# Patient Record
Sex: Female | Born: 1990
Health system: Southern US, Community
[De-identification: ages and names within clinical notes are randomized; demographics above are authoritative.]

## PROBLEM LIST (undated history)

## (undated) DIAGNOSIS — Z808 Family history of malignant neoplasm of other organs or systems: Secondary | ICD-10-CM

## (undated) DIAGNOSIS — G43909 Migraine, unspecified, not intractable, without status migrainosus: Secondary | ICD-10-CM

## (undated) DIAGNOSIS — Z8739 Personal history of other diseases of the musculoskeletal system and connective tissue: Secondary | ICD-10-CM

## (undated) DIAGNOSIS — M779 Enthesopathy, unspecified: Secondary | ICD-10-CM

## (undated) DIAGNOSIS — Z800 Family history of malignant neoplasm of digestive organs: Secondary | ICD-10-CM

## (undated) DIAGNOSIS — Z803 Family history of malignant neoplasm of breast: Secondary | ICD-10-CM

## (undated) DIAGNOSIS — Z8481 Family history of carrier of genetic disease: Secondary | ICD-10-CM

## (undated) DIAGNOSIS — N915 Oligomenorrhea, unspecified: Secondary | ICD-10-CM

## (undated) DIAGNOSIS — N63 Unspecified lump in unspecified breast: Secondary | ICD-10-CM

## (undated) DIAGNOSIS — S92902A Unspecified fracture of left foot, initial encounter for closed fracture: Secondary | ICD-10-CM

## (undated) DIAGNOSIS — Z1501 Genetic susceptibility to malignant neoplasm of breast: Secondary | ICD-10-CM

## (undated) DIAGNOSIS — F419 Anxiety disorder, unspecified: Secondary | ICD-10-CM

## (undated) HISTORY — DX: Genetic susceptibility to malignant neoplasm of breast: Z15.01

## (undated) HISTORY — DX: Enthesopathy, unspecified: M77.9

## (undated) HISTORY — DX: Genetic susceptibility to malignant neoplasm of ovary: Z15.01

## (undated) HISTORY — DX: Family history of malignant neoplasm of digestive organs: Z80.0

## (undated) HISTORY — DX: Anxiety disorder, unspecified: F41.9

## (undated) HISTORY — DX: Family history of malignant neoplasm of other organs or systems: Z80.8

## (undated) HISTORY — DX: Personal history of other diseases of the musculoskeletal system and connective tissue: Z87.39

## (undated) HISTORY — PX: OTHER SURGICAL HISTORY: SHX169

## (undated) HISTORY — DX: Family history of malignant neoplasm of breast: Z80.3

## (undated) HISTORY — DX: Oligomenorrhea, unspecified: N91.5

## (undated) HISTORY — DX: Migraine, unspecified, not intractable, without status migrainosus: G43.909

## (undated) HISTORY — PX: WISDOM TOOTH EXTRACTION: SHX21

## (undated) HISTORY — DX: Family history of carrier of genetic disease: Z84.81

## (undated) HISTORY — DX: Unspecified fracture of left foot, initial encounter for closed fracture: S92.902A

---
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
## 2012-08-17 HISTORY — PX: IRRIGATION AND DEBRIDEMENT SEBACEOUS CYST: SHX5255

## 2012-10-01 ENCOUNTER — Telehealth: Payer: Self-pay | Admitting: Nurse Practitioner

## 2012-10-01 NOTE — Telephone Encounter (Signed)
OK to refill Junel and make it without the iron or perfectly OK to skip that week.  I'm glad she is doing well on this. RF through to AEX.

## 2012-10-01 NOTE — Telephone Encounter (Signed)
LMTCB  aa 

## 2012-10-01 NOTE — Telephone Encounter (Signed)
Spoke with pt who is on a 3 month trial of Junel FE. Pt likes the med except for having some diarrhea 2 out of 3 months when taking the iron pills. Pt wondering if she can just take the pills without iron. Pt also will need a refill beyond this week as she is on her last pack.  Please advise.  aa

## 2012-10-01 NOTE — Telephone Encounter (Signed)
Junel FE/patient has questions for nurse/Buckner

## 2012-10-02 MED ORDER — NORETHINDRONE ACET-ETHINYL EST 1-20 MG-MCG PO TABS: 1.0000 | ORAL_TABLET | Freq: Every day | ORAL | Status: DC

## 2012-10-02 NOTE — Telephone Encounter (Signed)
Patient notified of new Rx sent to [facility] pharmacy without iron.

## 2013-04-17 ENCOUNTER — Other Ambulatory Visit: Payer: Self-pay | Admitting: Nurse Practitioner

## 2013-04-17 NOTE — Telephone Encounter (Signed)
Patient  Has annual schedule for 07/04/12 need a refill for medication

## 2013-07-04 ENCOUNTER — Ambulatory Visit (INDEPENDENT_AMBULATORY_CARE_PROVIDER_SITE_OTHER): Payer: 59 | Admitting: Nurse Practitioner

## 2013-07-04 ENCOUNTER — Encounter: Payer: Self-pay | Admitting: Nurse Practitioner

## 2013-07-04 VITALS — BP 130/88 | HR 80 | Ht 65.0 in | Wt 114.0 lb

## 2013-07-04 DIAGNOSIS — Z0000 Encounter for general adult medical examination without abnormal findings: Secondary | ICD-10-CM

## 2013-07-04 DIAGNOSIS — Z01419 Encounter for gynecological examination (general) (routine) without abnormal findings: Secondary | ICD-10-CM

## 2013-07-04 LAB — HEMOGLOBIN, FINGERSTICK: Hemoglobin, fingerstick: 14.4 g/dL (ref 12.0–16.0)

## 2013-07-04 MED ORDER — NORETHIN ACE-ETH ESTRAD-FE 1-20 MG-MCG PO TABS: 1.0000 | ORAL_TABLET | Freq: Every day | ORAL | Status: DC

## 2013-07-04 NOTE — Progress Notes (Signed)
Patient ID: Dawn CorporalLauren [name], female   DOB: 10/08/1990, 23 y.o.   MRN: [id number] 23 y.o. G0P0 Single Caucasian Fe here for annual exam.  Usually menses are 3-4 days and  much lighter and less headaches on OCP.  Never SA and not dating.  Continues to consider graduate degree in college for English major. She is currently working for her parents and seeking other employment.  Patient's last menstrual period was 06/08/2013.          Sexually active: no  The current method of family planning is abstinence.    Exercising: yes  Gym/ health club routine includes Jeet Kune Do, yoga, basic weight/core. Smoker:  no  Health Maintenance: Pap:  06/17/12, WNL TDaP: UTD Gardasil:  Declined Labs:  HB: 14.4    reports that she has never smoked. She has never used smokeless tobacco. She reports that she does not drink alcohol or use illicit drugs.  No past medical history on file.  Past Surgical History  Procedure Laterality Date  . Wisdom tooth extraction      Current Outpatient Prescriptions  Medication Sig Dispense Refill  . JUNEL FE 1/20 1-20 MG-MCG tablet Take 1 tablet by mouth daily.       No current facility-administered medications for this visit.    Family History  Problem Relation Age of Onset  . Hyperlipidemia Mother   . Hypertension Mother   . Breast cancer Maternal Grandmother 40  . Parkinson's disease Paternal Grandmother   . Colon cancer Paternal Grandfather     ROS:  Pertinent items are noted in HPI.  Otherwise, a comprehensive ROS was negative.  Exam:   BP 130/88  Pulse 80  Ht 5\' 5"  (1.651 m)  Wt 114 lb (51.71 kg)  BMI 18.97 kg/m2  LMP 06/08/2013 Height: 5\' 5"  (165.1 cm)  Ht Readings from Last 3 Encounters:  07/04/13 5\' 5"  (1.651 m)    General appearance: alert, cooperative and appears stated age Head: Normocephalic, without obvious abnormality, atraumatic Neck: no adenopathy, supple, symmetrical, trachea midline and thyroid normal to inspection and palpation Lungs:  clear to auscultation bilaterally Breasts: normal appearance, no masses or tenderness Heart: regular rate and rhythm Abdomen: soft, non-tender; no masses,  no organomegaly Extremities: extremities normal, atraumatic, no cyanosis or edema Skin: Skin color, texture, turgor normal. No rashes or lesions Lymph nodes: Cervical, supraclavicular, and axillary nodes normal. No abnormal inguinal nodes palpated Neurologic: Grossly normal   Pelvic: External genitalia:  no lesions              Urethra:  normal appearing urethra with no masses, tenderness or lesions              Bartholin's and Skene's: normal                 Vagina: normal appearing vagina with normal color and discharge, no lesions              Cervix: anteverted              Pap taken: no Bimanual Exam:  Uterus:  normal size, contour, position, consistency, mobility, non-tender              Adnexa: no mass, fullness, tenderness               Rectovaginal: Confirms               Anus:  normal sphincter tone, no lesions  A:  Well Woman with normal exam  History  of irregular menses and headaches - better on OCP  P:   Pap smear as per guidelines Not done  Refill on Loestrin Fe 1/20 for a year  Counseled on breast self exam, STD prevention, use and side effects of OCP's, adequate intake of calcium and vitamin D, diet and exercise return annually or prn  An After Visit Summary was printed and given to the patient.

## 2013-07-04 NOTE — Patient Instructions (Signed)
General topics  Next pap or exam is  due in 1 year Take a Women's multivitamin Take 1200 mg. of calcium daily - prefer dietary If any concerns in interim to call back  Breast Self-Awareness Practicing breast self-awareness may pick up problems early, prevent significant medical complications, and possibly save your life. By practicing breast self-awareness, you can become familiar with how your breasts look and feel and if your breasts are changing. This allows you to notice changes early. It can also offer you some reassurance that your breast health is good. One way to learn what is normal for your breasts and whether your breasts are changing is to do a breast self-exam. If you find a lump or something that was not present in the past, it is best to contact your caregiver right away. Other findings that should be evaluated by your caregiver include nipple discharge, especially if it is bloody; skin changes or reddening; areas where the skin seems to be pulled in (retracted); or new lumps and bumps. Breast pain is seldom associated with cancer (malignancy), but should also be evaluated by a caregiver. BREAST SELF-EXAM The best time to examine your breasts is 5 7 days after your menstrual period is over.  ExitCare Patient Information 2013 [employer].   Exercise to Stay Healthy Exercise helps you become and stay healthy. EXERCISE IDEAS AND TIPS Choose exercises that:  You enjoy.  Fit into your day. You do not need to exercise really hard to be healthy. You can do exercises at a slow or medium level and stay healthy. You can:  Stretch before and after working out.  Try yoga, Pilates, or tai chi.  Lift weights.  Walk fast, swim, jog, run, climb stairs, bicycle, dance, or rollerskate.  Take aerobic classes. Exercises that burn about 150 calories:  Running 1  miles in 15 minutes.  Playing volleyball for 45 to 60 minutes.  Washing and waxing a car for 45 to 60  minutes.  Playing touch football for 45 minutes.  Walking 1  miles in 35 minutes.  Pushing a stroller 1  miles in 30 minutes.  Playing basketball for 30 minutes.  Raking leaves for 30 minutes.  Bicycling 5 miles in 30 minutes.  Walking 2 miles in 30 minutes.  Dancing for 30 minutes.  Shoveling snow for 15 minutes.  Swimming laps for 20 minutes.  Walking up stairs for 15 minutes.  Bicycling 4 miles in 15 minutes.  Gardening for 30 to 45 minutes.  Jumping rope for 15 minutes.  Washing windows or floors for 45 to 60 minutes. Document Released: 07/08/2010 Document Revised: 08/28/2011 Document Reviewed: 07/08/2010 ExitCare Patient Information 2013 ExitCare[employer].   Other topics ( that may be useful information):    Sexually Transmitted Disease Sexually transmitted disease (STD) refers to any infection that is passed from person to person during sexual activity. This may happen by way of saliva, semen, blood, vaginal mucus, or urine. Common STDs include:  Gonorrhea.  Chlamydia.  Syphilis.  HIV/AIDS.  Genital herpes.  Hepatitis B and C.  Trichomonas.  Human papillomavirus (HPV).  Pubic lice. CAUSES  An STD may be spread by bacteria, virus, or parasite. A person can get an STD by:  Sexual intercourse with an infected person.  Sharing sex toys with an infected person.  Sharing needles with an infected person.  Having intimate contact with the genitals, mouth, or rectal areas of an infected person. SYMPTOMS  Some people may not have any symptoms, but   they can still pass the infection to others. Different STDs have different symptoms. Symptoms include:  Painful or bloody urination.  Pain in the pelvis, abdomen, vagina, anus, throat, or eyes.  Skin rash, itching, irritation, growths, or sores (lesions). These usually occur in the genital or anal area.  Abnormal vaginal discharge.  Penile discharge in men.  Soft, flesh-colored skin growths in the  genital or anal area.  Fever.  Pain or bleeding during sexual intercourse.  Swollen glands in the groin area.  Yellow skin and eyes (jaundice). This is seen with hepatitis. DIAGNOSIS  To make a diagnosis, your caregiver may:  Take a medical history.  Perform a physical exam.  Take a specimen (culture) to be examined.  Examine a sample of discharge under a microscope.  Perform blood test TREATMENT   Chlamydia, gonorrhea, trichomonas, and syphilis can be cured with antibiotic medicine.  Genital herpes, hepatitis, and HIV can be treated, but not cured, with prescribed medicines. The medicines will lessen the symptoms.  Genital warts from HPV can be treated with medicine or by freezing, burning (electrocautery), or surgery. Warts may come back.  HPV is a virus and cannot be cured with medicine or surgery.However, abnormal areas may be followed very closely by your caregiver and may be removed from the cervix, vagina, or vulva through office procedures or surgery. If your diagnosis is confirmed, your recent sexual partners need treatment. This is true even if they are symptom-free or have a negative culture or evaluation. They should not have sex until their caregiver says it is okay. HOME CARE INSTRUCTIONS  All sexual partners should be informed, tested, and treated for all STDs.  Take your antibiotics as directed. Finish them even if you start to feel better.  Only take over-the-counter or prescription medicines for pain, discomfort, or fever as directed by your caregiver.  Rest.  Eat a balanced diet and drink enough fluids to keep your urine clear or pale yellow.  Do not have sex until treatment is completed and you have followed up with your caregiver. STDs should be checked after treatment.  Keep all follow-up appointments, Pap tests, and blood tests as directed by your caregiver.  Only use latex condoms and water-soluble lubricants during sexual activity. Do not use  petroleum jelly or oils.  Avoid alcohol and illegal drugs.  Get vaccinated for HPV and hepatitis. If you have not received these vaccines in the past, talk to your caregiver about whether one or both might be right for you.  Avoid risky sex practices that can break the skin. The only way to avoid getting an STD is to avoid all sexual activity.Latex condoms and dental dams (for oral sex) will help lessen the risk of getting an STD, but will not completely eliminate the risk. SEEK MEDICAL CARE IF:   You have a fever.  You have any new or worsening symptoms. Document Released: 08/26/2002 Document Revised: 08/28/2011 Document Reviewed: 09/02/2010 [facility] Patient Information 2013 [employer].    Domestic Abuse You are being battered or abused if someone close to you hits, pushes, or physically hurts you in any way. You also are being abused if you are forced into activities. You are being sexually abused if you are forced to have sexual contact of any kind. You are being emotionally abused if you are made to feel worthless or if you are constantly threatened. It is important to remember that help is available. No one has the right to abuse you. PREVENTION OF FURTHER  ABUSE  Learn the warning signs of danger. This varies with situations but may include: the use of alcohol, threats, isolation from friends and family, or forced sexual contact. Leave if you feel that violence is going to occur.  If you are attacked or beaten, report it to the police so the abuse is documented. You do not have to press charges. The police can protect you while you or the attackers are leaving. Get the officer's name and badge number and a copy of the report.  Find someone you can trust and tell them what is happening to you: your caregiver, a nurse, clergy member, close friend or family member. Feeling ashamed is natural, but remember that you have done nothing wrong. No one deserves abuse. Document Released:  06/02/2000 Document Revised: 08/28/2011 Document Reviewed: 08/11/2010 ExitCare Patient Information 2013 ExitCare[employer] LLC.    How Much is Too Much Alcohol? Drinking too much alcohol can cause injury, accidents, and health problems. These types of problems can include:   Car crashes.  Falls.  Family fighting (domestic violence).  Drowning.  Fights.  Injuries.  Burns.  Damage to certain organs.  Having a baby with birth defects. ONE DRINK CAN BE TOO MUCH WHEN YOU ARE:  Working.  Pregnant or breastfeeding.  Taking medicines. Ask your doctor.  Driving or planning to drive. If you or someone you know has a drinking problem, get help from a doctor.  Document Released: 04/01/2009 Document Revised: 08/28/2011 Document Reviewed: 04/01/2009 ExitCare Patient Information 2013 ExitCare[employer].   Smoking Hazards Smoking cigarettes is extremely bad for your health. Tobacco smoke has over 200 known poisons in it. There are over 60 chemicals in tobacco smoke that cause cancer. Some of the chemicals found in cigarette smoke include:   Cyanide.  Benzene.  Formaldehyde.  Methanol (wood alcohol).  Acetylene (fuel used in welding torches).  Ammonia. Cigarette smoke also contains the poisonous gases nitrogen oxide and carbon monoxide.  Cigarette smokers have an increased risk of many serious medical problems and Smoking causes approximately:  90% of all lung cancer deaths in men.  80% of all lung cancer deaths in women.  90% of deaths from chronic obstructive lung disease. Compared with nonsmokers, smoking increases the risk of:  Coronary heart disease by 2 to 4 times.  Stroke by 2 to 4 times.  Men developing lung cancer by 23 times.  Women developing lung cancer by 13 times.  Dying from chronic obstructive lung diseases by 12 times.  . Smoking is the most preventable cause of death and disease in our society.  WHY IS SMOKING ADDICTIVE?  Nicotine is the chemical  agent in tobacco that is capable of causing addiction or dependence.  When you smoke and inhale, nicotine is absorbed rapidly into the bloodstream through your lungs. Nicotine absorbed through the lungs is capable of creating a powerful addiction. Both inhaled and non-inhaled nicotine may be addictive.  Addiction studies of cigarettes and spit tobacco show that addiction to nicotine occurs mainly during the teen years, when young people begin using tobacco products. WHAT ARE THE BENEFITS OF QUITTING?  There are many health benefits to quitting smoking.   Likelihood of developing cancer and heart disease decreases. Health improvements are seen almost immediately.  Blood pressure, pulse rate, and breathing patterns start returning to normal soon after quitting. QUITTING SMOKING   American Lung Association - 1-800-LUNGUSA  American Cancer Society - 1-800-ACS-2345 Document Released: 07/13/2004 Document Revised: 08/28/2011 Document Reviewed: 03/17/2009 ExitCare Patient Information 2013 ExitCare,   LLC.   Stress Management Stress is a state of physical or mental tension that often results from changes in your life or normal routine. Some common causes of stress are:  Death of a loved one.  Injuries or severe illnesses.  Getting fired or changing jobs.  Moving into a new home. Other causes may be:  Sexual problems.  Business or financial losses.  Taking on a large debt.  Regular conflict with someone at home or at work.  Constant tiredness from lack of sleep. It is not just bad things that are stressful. It may be stressful to:  Win the lottery.  Get married.  Buy a new car. The amount of stress that can be easily tolerated varies from person to person. Changes generally cause stress, regardless of the types of change. Too much stress can affect your health. It may lead to physical or emotional problems. Too little stress (boredom) may also become stressful. SUGGESTIONS TO  REDUCE STRESS:  Talk things over with your family and friends. It often is helpful to share your concerns and worries. If you feel your problem is serious, you may want to get help from a professional counselor.  Consider your problems one at a time instead of lumping them all together. Trying to take care of everything at once may seem impossible. List all the things you need to do and then start with the most important one. Set a goal to accomplish 2 or 3 things each day. If you expect to do too many in a single day you will naturally fail, causing you to feel even more stressed.  Do not use alcohol or drugs to relieve stress. Although you may feel better for a short time, they do not remove the problems that caused the stress. They can also be habit forming.  Exercise regularly - at least 3 times per week. Physical exercise can help to relieve that "uptight" feeling and will relax you.  The shortest distance between despair and hope is often a good night's sleep.  Go to bed and get up on time allowing yourself time for appointments without being rushed.  Take a short "time-out" period from any stressful situation that occurs during the day. Close your eyes and take some deep breaths. Starting with the muscles in your face, tense them, hold it for a few seconds, then relax. Repeat this with the muscles in your neck, shoulders, hand, stomach, back and legs.  Take good care of yourself. Eat a balanced diet and get plenty of rest.  Schedule time for having fun. Take a break from your daily routine to relax. HOME CARE INSTRUCTIONS   Call if you feel overwhelmed by your problems and feel you can no longer manage them on your own.  Return immediately if you feel like hurting yourself or someone else. Document Released: 11/29/2000 Document Revised: 08/28/2011 Document Reviewed: 07/22/2007 ExitCare Patient Information 2013 ExitCare, LLC.  

## 2013-07-07 NOTE — Progress Notes (Signed)
Encounter reviewed by Dr. Larraine Argo Silva.  

## 2014-06-22 ENCOUNTER — Ambulatory Visit: Payer: Self-pay | Admitting: Nurse Practitioner

## 2014-07-10 ENCOUNTER — Ambulatory Visit: Payer: 59 | Admitting: Nurse Practitioner

## 2014-07-13 ENCOUNTER — Other Ambulatory Visit: Payer: Self-pay | Admitting: *Deleted

## 2014-07-13 MED ORDER — NORETHIN ACE-ETH ESTRAD-FE 1-20 MG-MCG PO TABS: 1.0000 | ORAL_TABLET | Freq: Every day | ORAL | Status: DC

## 2014-07-13 NOTE — Telephone Encounter (Signed)
Incoming fax from [facility]  Medication refill request: Junel Fe 1/20 Last AEX:  07/04/13 Next AEX: 08/18/14 Last MMG (if hormonal medication request): None Refill authorized: 07/04/13 #3packs/3R. Today #3packs/0R?

## 2014-08-03 ENCOUNTER — Other Ambulatory Visit: Payer: Self-pay | Admitting: Nurse Practitioner

## 2014-08-03 ENCOUNTER — Ambulatory Visit: Payer: Self-pay | Admitting: Nurse Practitioner

## 2014-08-03 NOTE — Telephone Encounter (Signed)
Medication refill request: Junel 1/20 mcg Last AEX:  07/04/13 with Ms. Patty Next AEX: 10/13/14 with MS. Patty Last MMG (if hormonal medication request): N/A Refill authorized: #3 Packs/0 rfs, please advise.

## 2014-08-03 NOTE — Telephone Encounter (Signed)
Patient requesting refills on Junel FE. Pharmacy on file is correct. AEX 10/13/14.

## 2014-08-04 MED ORDER — NORETHIN ACE-ETH ESTRAD-FE 1-20 MG-MCG PO TABS: 1.0000 | ORAL_TABLET | Freq: Every day | ORAL | Status: DC

## 2014-08-04 NOTE — Telephone Encounter (Signed)
Called and s/w patient she is aware. 

## 2014-08-18 ENCOUNTER — Ambulatory Visit: Payer: Self-pay | Admitting: Nurse Practitioner

## 2014-08-20 ENCOUNTER — Encounter: Payer: Self-pay | Admitting: Nurse Practitioner

## 2014-08-20 ENCOUNTER — Ambulatory Visit (INDEPENDENT_AMBULATORY_CARE_PROVIDER_SITE_OTHER): Payer: BLUE CROSS/BLUE SHIELD | Admitting: Nurse Practitioner

## 2014-08-20 VITALS — BP 120/78 | HR 92 | Ht 64.75 in | Wt 124.6 lb

## 2014-08-20 DIAGNOSIS — R8290 Unspecified abnormal findings in urine: Secondary | ICD-10-CM

## 2014-08-20 DIAGNOSIS — Z0000 Encounter for general adult medical examination without abnormal findings: Secondary | ICD-10-CM | POA: Diagnosis not present

## 2014-08-20 DIAGNOSIS — Z01419 Encounter for gynecological examination (general) (routine) without abnormal findings: Secondary | ICD-10-CM | POA: Diagnosis not present

## 2014-08-20 LAB — TSH: TSH: 3.017 u[IU]/mL (ref 0.350–4.500)

## 2014-08-20 LAB — POCT URINALYSIS DIPSTICK
Urobilinogen, UA: NEGATIVE
pH, UA: 5

## 2014-08-20 LAB — COMPREHENSIVE METABOLIC PANEL
ALBUMIN: 5 g/dL (ref 3.5–5.2)
ALT: 16 U/L (ref 0–35)
AST: 22 U/L (ref 0–37)
Alkaline Phosphatase: 33 U/L — ABNORMAL LOW (ref 39–117)
BUN: 7 mg/dL (ref 6–23)
CALCIUM: 9.8 mg/dL (ref 8.4–10.5)
CHLORIDE: 101 meq/L (ref 96–112)
CO2: 24 meq/L (ref 19–32)
Creat: 0.64 mg/dL (ref 0.50–1.10)
Glucose, Bld: 92 mg/dL (ref 70–99)
Potassium: 3.4 mEq/L — ABNORMAL LOW (ref 3.5–5.3)
Sodium: 139 mEq/L (ref 135–145)
TOTAL PROTEIN: 8.2 g/dL (ref 6.0–8.3)
Total Bilirubin: 0.7 mg/dL (ref 0.2–1.2)

## 2014-08-20 LAB — LIPID PANEL
Cholesterol: 207 mg/dL — ABNORMAL HIGH (ref 0–200)
HDL: 75 mg/dL (ref >=46)
LDL Cholesterol: 117 mg/dL — ABNORMAL HIGH (ref 0–99)
TRIGLYCERIDES: 73 mg/dL (ref <150)
Total CHOL/HDL Ratio: 2.8 Ratio
VLDL: 15 mg/dL (ref 0–40)

## 2014-08-20 LAB — HEMOGLOBIN, FINGERSTICK: Hemoglobin, fingerstick: 15 g/dL (ref 12.0–16.0)

## 2014-08-20 MED ORDER — JUNEL FE 1/20 1-20 MG-MCG PO TABS: 1.0000 | ORAL_TABLET | Freq: Every day | ORAL | Status: DC

## 2014-08-20 NOTE — Patient Instructions (Signed)
General topics  Next pap or exam is  due in 1 year Take a Women's multivitamin Take 1200 mg. of calcium daily - prefer dietary If any concerns in interim to call back  Breast Self-Awareness Practicing breast self-awareness may pick up problems early, prevent significant medical complications, and possibly save your life. By practicing breast self-awareness, you can become familiar with how your breasts look and feel and if your breasts are changing. This allows you to notice changes early. It can also offer you some reassurance that your breast health is good. One way to learn what is normal for your breasts and whether your breasts are changing is to do a breast self-exam. If you find a lump or something that was not present in the past, it is best to contact your caregiver right away. Other findings that should be evaluated by your caregiver include nipple discharge, especially if it is bloody; skin changes or reddening; areas where the skin seems to be pulled in (retracted); or new lumps and bumps. Breast pain is seldom associated with cancer (malignancy), but should also be evaluated by a caregiver. BREAST SELF-EXAM The best time to examine your breasts is 5 7 days after your menstrual period is over.  ExitCare Patient Information 2013 [employer].   Exercise to Stay Healthy Exercise helps you become and stay healthy. EXERCISE IDEAS AND TIPS Choose exercises that:  You enjoy.  Fit into your day. You do not need to exercise really hard to be healthy. You can do exercises at a slow or medium level and stay healthy. You can:  Stretch before and after working out.  Try yoga, Pilates, or tai chi.  Lift weights.  Walk fast, swim, jog, run, climb stairs, bicycle, dance, or rollerskate.  Take aerobic classes. Exercises that burn about 150 calories:  Running 1  miles in 15 minutes.  Playing volleyball for 45 to 60 minutes.  Washing and waxing a car for 45 to 60  minutes.  Playing touch football for 45 minutes.  Walking 1  miles in 35 minutes.  Pushing a stroller 1  miles in 30 minutes.  Playing basketball for 30 minutes.  Raking leaves for 30 minutes.  Bicycling 5 miles in 30 minutes.  Walking 2 miles in 30 minutes.  Dancing for 30 minutes.  Shoveling snow for 15 minutes.  Swimming laps for 20 minutes.  Walking up stairs for 15 minutes.  Bicycling 4 miles in 15 minutes.  Gardening for 30 to 45 minutes.  Jumping rope for 15 minutes.  Washing windows or floors for 45 to 60 minutes. Document Released: 07/08/2010 Document Revised: 08/28/2011 Document Reviewed: 07/08/2010 ExitCare Patient Information 2013 ExitCare[employer].   Other topics ( that may be useful information):    Sexually Transmitted Disease Sexually transmitted disease (STD) refers to any infection that is passed from person to person during sexual activity. This may happen by way of saliva, semen, blood, vaginal mucus, or urine. Common STDs include:  Gonorrhea.  Chlamydia.  Syphilis.  HIV/AIDS.  Genital herpes.  Hepatitis B and C.  Trichomonas.  Human papillomavirus (HPV).  Pubic lice. CAUSES  An STD may be spread by bacteria, virus, or parasite. A person can get an STD by:  Sexual intercourse with an infected person.  Sharing sex toys with an infected person.  Sharing needles with an infected person.  Having intimate contact with the genitals, mouth, or rectal areas of an infected person. SYMPTOMS  Some people may not have any symptoms, but   they can still pass the infection to others. Different STDs have different symptoms. Symptoms include:  Painful or bloody urination.  Pain in the pelvis, abdomen, vagina, anus, throat, or eyes.  Skin rash, itching, irritation, growths, or sores (lesions). These usually occur in the genital or anal area.  Abnormal vaginal discharge.  Penile discharge in men.  Soft, flesh-colored skin growths in the  genital or anal area.  Fever.  Pain or bleeding during sexual intercourse.  Swollen glands in the groin area.  Yellow skin and eyes (jaundice). This is seen with hepatitis. DIAGNOSIS  To make a diagnosis, your caregiver may:  Take a medical history.  Perform a physical exam.  Take a specimen (culture) to be examined.  Examine a sample of discharge under a microscope.  Perform blood test TREATMENT   Chlamydia, gonorrhea, trichomonas, and syphilis can be cured with antibiotic medicine.  Genital herpes, hepatitis, and HIV can be treated, but not cured, with prescribed medicines. The medicines will lessen the symptoms.  Genital warts from HPV can be treated with medicine or by freezing, burning (electrocautery), or surgery. Warts may come back.  HPV is a virus and cannot be cured with medicine or surgery.However, abnormal areas may be followed very closely by your caregiver and may be removed from the cervix, vagina, or vulva through office procedures or surgery. If your diagnosis is confirmed, your recent sexual partners need treatment. This is true even if they are symptom-free or have a negative culture or evaluation. They should not have sex until their caregiver says it is okay. HOME CARE INSTRUCTIONS  All sexual partners should be informed, tested, and treated for all STDs.  Take your antibiotics as directed. Finish them even if you start to feel better.  Only take over-the-counter or prescription medicines for pain, discomfort, or fever as directed by your caregiver.  Rest.  Eat a balanced diet and drink enough fluids to keep your urine clear or pale yellow.  Do not have sex until treatment is completed and you have followed up with your caregiver. STDs should be checked after treatment.  Keep all follow-up appointments, Pap tests, and blood tests as directed by your caregiver.  Only use latex condoms and water-soluble lubricants during sexual activity. Do not use  petroleum jelly or oils.  Avoid alcohol and illegal drugs.  Get vaccinated for HPV and hepatitis. If you have not received these vaccines in the past, talk to your caregiver about whether one or both might be right for you.  Avoid risky sex practices that can break the skin. The only way to avoid getting an STD is to avoid all sexual activity.Latex condoms and dental dams (for oral sex) will help lessen the risk of getting an STD, but will not completely eliminate the risk. SEEK MEDICAL CARE IF:   You have a fever.  You have any new or worsening symptoms. Document Released: 08/26/2002 Document Revised: 08/28/2011 Document Reviewed: 09/02/2010 [facility] Patient Information 2013 [employer].    Domestic Abuse You are being battered or abused if someone close to you hits, pushes, or physically hurts you in any way. You also are being abused if you are forced into activities. You are being sexually abused if you are forced to have sexual contact of any kind. You are being emotionally abused if you are made to feel worthless or if you are constantly threatened. It is important to remember that help is available. No one has the right to abuse you. PREVENTION OF FURTHER  ABUSE  Learn the warning signs of danger. This varies with situations but may include: the use of alcohol, threats, isolation from friends and family, or forced sexual contact. Leave if you feel that violence is going to occur.  If you are attacked or beaten, report it to the police so the abuse is documented. You do not have to press charges. The police can protect you while you or the attackers are leaving. Get the officer's name and badge number and a copy of the report.  Find someone you can trust and tell them what is happening to you: your caregiver, a nurse, clergy member, close friend or family member. Feeling ashamed is natural, but remember that you have done nothing wrong. No one deserves abuse. Document Released:  06/02/2000 Document Revised: 08/28/2011 Document Reviewed: 08/11/2010 ExitCare Patient Information 2013 ExitCare[employer] LLC.    How Much is Too Much Alcohol? Drinking too much alcohol can cause injury, accidents, and health problems. These types of problems can include:   Car crashes.  Falls.  Family fighting (domestic violence).  Drowning.  Fights.  Injuries.  Burns.  Damage to certain organs.  Having a baby with birth defects. ONE DRINK CAN BE TOO MUCH WHEN YOU ARE:  Working.  Pregnant or breastfeeding.  Taking medicines. Ask your doctor.  Driving or planning to drive. If you or someone you know has a drinking problem, get help from a doctor.  Document Released: 04/01/2009 Document Revised: 08/28/2011 Document Reviewed: 04/01/2009 ExitCare Patient Information 2013 ExitCare[employer].   Smoking Hazards Smoking cigarettes is extremely bad for your health. Tobacco smoke has over 200 known poisons in it. There are over 60 chemicals in tobacco smoke that cause cancer. Some of the chemicals found in cigarette smoke include:   Cyanide.  Benzene.  Formaldehyde.  Methanol (wood alcohol).  Acetylene (fuel used in welding torches).  Ammonia. Cigarette smoke also contains the poisonous gases nitrogen oxide and carbon monoxide.  Cigarette smokers have an increased risk of many serious medical problems and Smoking causes approximately:  90% of all lung cancer deaths in men.  80% of all lung cancer deaths in women.  90% of deaths from chronic obstructive lung disease. Compared with nonsmokers, smoking increases the risk of:  Coronary heart disease by 2 to 4 times.  Stroke by 2 to 4 times.  Men developing lung cancer by 23 times.  Women developing lung cancer by 13 times.  Dying from chronic obstructive lung diseases by 12 times.  . Smoking is the most preventable cause of death and disease in our society.  WHY IS SMOKING ADDICTIVE?  Nicotine is the chemical  agent in tobacco that is capable of causing addiction or dependence.  When you smoke and inhale, nicotine is absorbed rapidly into the bloodstream through your lungs. Nicotine absorbed through the lungs is capable of creating a powerful addiction. Both inhaled and non-inhaled nicotine may be addictive.  Addiction studies of cigarettes and spit tobacco show that addiction to nicotine occurs mainly during the teen years, when young people begin using tobacco products. WHAT ARE THE BENEFITS OF QUITTING?  There are many health benefits to quitting smoking.   Likelihood of developing cancer and heart disease decreases. Health improvements are seen almost immediately.  Blood pressure, pulse rate, and breathing patterns start returning to normal soon after quitting. QUITTING SMOKING   American Lung Association - 1-800-LUNGUSA  American Cancer Society - 1-800-ACS-2345 Document Released: 07/13/2004 Document Revised: 08/28/2011 Document Reviewed: 03/17/2009 ExitCare Patient Information 2013 ExitCare,   LLC.   Stress Management Stress is a state of physical or mental tension that often results from changes in your life or normal routine. Some common causes of stress are:  Death of a loved one.  Injuries or severe illnesses.  Getting fired or changing jobs.  Moving into a new home. Other causes may be:  Sexual problems.  Business or financial losses.  Taking on a large debt.  Regular conflict with someone at home or at work.  Constant tiredness from lack of sleep. It is not just bad things that are stressful. It may be stressful to:  Win the lottery.  Get married.  Buy a new car. The amount of stress that can be easily tolerated varies from person to person. Changes generally cause stress, regardless of the types of change. Too much stress can affect your health. It may lead to physical or emotional problems. Too little stress (boredom) may also become stressful. SUGGESTIONS TO  REDUCE STRESS:  Talk things over with your family and friends. It often is helpful to share your concerns and worries. If you feel your problem is serious, you may want to get help from a professional counselor.  Consider your problems one at a time instead of lumping them all together. Trying to take care of everything at once may seem impossible. List all the things you need to do and then start with the most important one. Set a goal to accomplish 2 or 3 things each day. If you expect to do too many in a single day you will naturally fail, causing you to feel even more stressed.  Do not use alcohol or drugs to relieve stress. Although you may feel better for a short time, they do not remove the problems that caused the stress. They can also be habit forming.  Exercise regularly - at least 3 times per week. Physical exercise can help to relieve that "uptight" feeling and will relax you.  The shortest distance between despair and hope is often a good night's sleep.  Go to bed and get up on time allowing yourself time for appointments without being rushed.  Take a short "time-out" period from any stressful situation that occurs during the day. Close your eyes and take some deep breaths. Starting with the muscles in your face, tense them, hold it for a few seconds, then relax. Repeat this with the muscles in your neck, shoulders, hand, stomach, back and legs.  Take good care of yourself. Eat a balanced diet and get plenty of rest.  Schedule time for having fun. Take a break from your daily routine to relax. HOME CARE INSTRUCTIONS   Call if you feel overwhelmed by your problems and feel you can no longer manage them on your own.  Return immediately if you feel like hurting yourself or someone else. Document Released: 11/29/2000 Document Revised: 08/28/2011 Document Reviewed: 07/22/2007 ExitCare Patient Information 2013 ExitCare, LLC.  

## 2014-08-20 NOTE — Progress Notes (Signed)
24 y.o. G0 Single  Caucasian Fe here for annual exam.  Still on OCP and doing well.  Menses at 2-3 days and light.  Still some HA's but not related to menses and not worse this past year. Just applied to [employer] for [employer].  Not dating or SA ever.  FMH: of high cholesterol.  She is having some anxiety issues and would like to speak to a counselor.  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  Patient's last menstrual period was 08/06/2014.          Sexually active: No.  The current method of family planning is oral OCP Exercising: Yes.    walking  couples x's/wk Smoker:  No   Health Maintenance: Pap:  06/17/12 Negative  TDaP:  2008 Labs: Hgb: 15.0 ; Urine: Trace Protein, WBC'S +, Ketones Small   reports that she has never smoked. She has never used smokeless tobacco. She reports that she drinks about 4.2 oz of alcohol per week. She reports that she does not use illicit drugs.  Past Medical History  Diagnosis Date  . Fracture of left foot 2007    treated with brace  . Oligomenorrhea     better on OCP  . Migraine     better with OCP    Past Surgical History  Procedure Laterality Date  . Wisdom tooth extraction    . Irrigation and debridement sebaceous cyst  08/2012    right shoulder area    Current Outpatient Prescriptions  Medication Sig Dispense Refill  . JUNEL FE 1/20 1-20 MG-MCG tablet Take 1 tablet by mouth daily. 3 Package 3   No current facility-administered medications for this visit.    Family History  Problem Relation Age of Onset  . Hyperlipidemia Mother   . Hypertension Mother   . Breast cancer Maternal Grandmother 40  . Parkinson's disease Paternal Grandmother   . Colon cancer Paternal Grandfather     ROS:  Pertinent items are noted in HPI.  Otherwise, a comprehensive ROS was negative.  Exam:   BP 120/78 mmHg  Pulse 92  Ht 5' 4.75" (1.645 m)  Wt 124 lb 9.6 oz (56.518 kg)  BMI 20.89 kg/m2  LMP 08/06/2014 Height: 5' 4.75" (164.5 cm) Ht  Readings from Last 3 Encounters:  08/20/14 5' 4.75" (1.645 m)  07/04/13  (1.651 m)    General appearance: alert, cooperative and appears stated age Head: Normocephalic, without obvious abnormality, atraumatic Neck: no adenopathy, supple, symmetrical, trachea midline and thyroid normal to inspection and palpation Lungs: clear to auscultation bilaterally Breasts: normal appearance, no masses or tenderness Heart: regular rate and rhythm Abdomen: soft, non-tender; no masses,  no organomegaly Extremities: extremities normal, atraumatic, no cyanosis or edema Skin: Skin color, texture, turgor normal. No rashes or lesions Lymph nodes: Cervical, supraclavicular, and axillary nodes normal. No abnormal inguinal nodes palpated Neurologic: Grossly normal   Pelvic: External genitalia:  no lesions              Urethra:  normal appearing urethra with no masses, tenderness or lesions              Bartholin's and Skene's: normal                 Vagina: very small introitus - normal appearing vagina with normal color and discharge, no lesions              Cervix: anteverted  Pap taken: No. Bimanual Exam:  Uterus:  normal size, contour, position, consistency, mobility, non-tender              Adnexa: no mass, fullness, tenderness               Rectovaginal: not done               Anus:  Not examined  Chaperone present: No  A:  Well Woman with normal exam  History of irregular menses and headaches - better on OCP  Never SA  History of anxiety  FMH: high cholesterol  P:   Reviewed health and wellness pertinent to exam  Pap smear not taken today  Refill on Junel Fe 1/20 for a year  Name and card given for Limited BrandsJulie Whitt   Counseled on breast self exam, use and side effects of OCP's, adequate intake of calcium and vitamin D, diet and exercise return annually or prn  An After Visit Summary was printed and given to the patient.

## 2014-08-21 LAB — VITAMIN D 25 HYDROXY (VIT D DEFICIENCY, FRACTURES): VIT D 25 HYDROXY: 18 ng/mL — AB (ref 30–100)

## 2014-08-22 LAB — URINE CULTURE
Colony Count: NO GROWTH
Organism ID, Bacteria: NO GROWTH

## 2014-08-23 ENCOUNTER — Other Ambulatory Visit: Payer: Self-pay | Admitting: Nurse Practitioner

## 2014-08-23 MED ORDER — VITAMIN D (ERGOCALCIFEROL) 1.25 MG (50000 UNIT) PO CAPS: 50000.0000 [IU] | ORAL_CAPSULE | ORAL | Status: DC

## 2014-08-24 ENCOUNTER — Encounter: Payer: Self-pay | Admitting: Nurse Practitioner

## 2014-08-24 NOTE — Progress Notes (Signed)
Encounter reviewed by Dr. Brook Silva.  

## 2014-08-31 ENCOUNTER — Encounter: Payer: Self-pay | Admitting: Nurse Practitioner

## 2014-10-13 ENCOUNTER — Ambulatory Visit: Payer: Self-pay | Admitting: Nurse Practitioner

## 2015-08-23 ENCOUNTER — Ambulatory Visit: Payer: BLUE CROSS/BLUE SHIELD | Admitting: Nurse Practitioner

## 2015-09-03 ENCOUNTER — Ambulatory Visit (INDEPENDENT_AMBULATORY_CARE_PROVIDER_SITE_OTHER): Payer: BLUE CROSS/BLUE SHIELD | Admitting: Nurse Practitioner

## 2015-09-03 ENCOUNTER — Encounter: Payer: Self-pay | Admitting: Nurse Practitioner

## 2015-09-03 VITALS — BP 118/82 | HR 74 | Resp 16 | Ht 65.25 in | Wt 127.0 lb

## 2015-09-03 DIAGNOSIS — Z0000 Encounter for general adult medical examination without abnormal findings: Secondary | ICD-10-CM | POA: Diagnosis not present

## 2015-09-03 DIAGNOSIS — Z01419 Encounter for gynecological examination (general) (routine) without abnormal findings: Secondary | ICD-10-CM

## 2015-09-03 LAB — POCT URINALYSIS DIPSTICK
Bilirubin, UA: NEGATIVE
Glucose, UA: NEGATIVE
Ketones, UA: NEGATIVE
Leukocytes, UA: NEGATIVE
Nitrite, UA: NEGATIVE
PROTEIN UA: NEGATIVE
RBC UA: NEGATIVE
UROBILINOGEN UA: NEGATIVE
pH, UA: 5

## 2015-09-03 LAB — LIPID PANEL
CHOL/HDL RATIO: 2.9 ratio (ref <=5.0)
CHOLESTEROL: 220 mg/dL — AB (ref 125–200)
HDL: 75 mg/dL (ref >=46)
LDL Cholesterol: 122 mg/dL (ref <130)
Triglycerides: 113 mg/dL (ref <150)
VLDL: 23 mg/dL (ref <30)

## 2015-09-03 LAB — TSH: TSH: 2.76 m[IU]/L

## 2015-09-03 MED ORDER — JUNEL FE 1/20 1-20 MG-MCG PO TABS: 1.0000 | ORAL_TABLET | Freq: Every day | ORAL | Status: DC

## 2015-09-03 NOTE — Progress Notes (Signed)
25 y.o. G0P0 Single  Caucasian Fe here for annual exam.  Menses 2-4 days. Mild cramps, slight PMS.  Not dating or SA ever.    Patient's last menstrual period was 08/31/2015.          Sexually active: No.  The current method of family planning is OCP (estrogen/progesterone).    Exercising: Yes.    walking Smoker:  no  Health Maintenance: Pap:  06-17-12 neg TDaP:  2008 HIV:  Done today Labs: poct urine-neg, Hgb-14.3 Self breast exam: not done   reports that she has never smoked. She has never used smokeless tobacco. She reports that she drinks about 0.6 oz of alcohol per week. She reports that she does not use illicit drugs.  Past Medical History  Diagnosis Date  . Fracture of left foot 2007    treated with brace  . Oligomenorrhea     better on OCP  . Migraine     better with OCP    Past Surgical History  Procedure Laterality Date  . Wisdom tooth extraction    . Irrigation and debridement sebaceous cyst  08/2012    right shoulder area  . Fishbone removal      pt swallowed fishbone & had to be surgically removed    Current Outpatient Prescriptions  Medication Sig Dispense Refill  . JUNEL FE 1/20 1-20 MG-MCG tablet Take 1 tablet by mouth daily. 3 Package 4  . L-THEANINE PO Take by mouth as needed.    . Loratadine 10 MG CAPS Take by mouth.    Marland Kitchen UNABLE TO FIND Ranitidine prn     No current facility-administered medications for this visit.    Family History  Problem Relation Age of Onset  . Hyperlipidemia Mother   . Hypertension Mother   . Breast cancer Maternal Grandmother 40  . Parkinson's disease Paternal Grandmother   . Colon cancer Paternal Grandfather     ROS:  Pertinent items are noted in HPI.  Otherwise, a comprehensive ROS was negative.  Exam:   BP 118/82 mmHg  Pulse 74  Resp 16  Ht 5' 5.25" (1.657 m)  Wt 127 lb (57.607 kg)  BMI 20.98 kg/m2  LMP 08/31/2015 Height: 5' 5.25" (165.7 cm) Ht Readings from Last 3 Encounters:  09/03/15 5' 5.25" (1.657 m)   08/20/14 5' 4.75" (1.645 m)  07/04/13  (1.651 m)    General appearance: alert, cooperative and appears stated age Head: Normocephalic, without obvious abnormality, atraumatic Neck: no adenopathy, supple, symmetrical, trachea midline and thyroid normal to inspection and palpation Lungs: clear to auscultation bilaterally Breasts: normal appearance, no masses or tenderness Heart: regular rate and rhythm Abdomen: soft, non-tender; no masses,  no organomegaly Extremities: extremities normal, atraumatic, no cyanosis or edema Skin: Skin color, texture, turgor normal. No rashes or lesions Lymph nodes: Cervical, supraclavicular, and axillary nodes normal. No abnormal inguinal nodes palpated Neurologic: Grossly normal   Pelvic: External genitalia:  no lesions              Urethra:  normal appearing urethra with no masses, tenderness or lesions              Bartholin's and Skene's: normal                 Vagina: normal appearing vagina with normal color and discharge, no lesions              Cervix: anteverted  Pap taken: Yes.   Bimanual Exam:  Uterus:  normal size, contour, position, consistency, mobility, non-tender              Adnexa: no mass, fullness, tenderness               Rectovaginal: Confirms               Anus:  normal sphincter tone, no lesions  Chaperone present: yes  A:  Well Woman with normal exam  History of irregular menses and headaches - better on OCP Never SA History of anxiety FMH: high cholesterol   P:   Reviewed health and wellness pertinent to exam  Pap smear as above  Refill on OCP Junel Fe 1/20 Brand only for a year  Follow with labs  Counseled on breast self exam, adequate intake of calcium and vitamin D, diet and exercise return annually or prn  An After Visit Summary was printed and given to the patient.

## 2015-09-03 NOTE — Patient Instructions (Signed)
General topics  Next pap or exam is  due in 1 year Take a Women's multivitamin Take 1200 mg. of calcium daily - prefer dietary If any concerns in interim to call back  Breast Self-Awareness Practicing breast self-awareness may pick up problems early, prevent significant medical complications, and possibly save your life. By practicing breast self-awareness, you can become familiar with how your breasts look and feel and if your breasts are changing. This allows you to notice changes early. It can also offer you some reassurance that your breast health is good. One way to learn what is normal for your breasts and whether your breasts are changing is to do a breast self-exam. If you find a lump or something that was not present in the past, it is best to contact your caregiver right away. Other findings that should be evaluated by your caregiver include nipple discharge, especially if it is bloody; skin changes or reddening; areas where the skin seems to be pulled in (retracted); or new lumps and bumps. Breast pain is seldom associated with cancer (malignancy), but should also be evaluated by a caregiver. BREAST SELF-EXAM The best time to examine your breasts is 5 7 days after your menstrual period is over.  ExitCare Patient Information 2013 [employer].   Exercise to Stay Healthy Exercise helps you become and stay healthy. EXERCISE IDEAS AND TIPS Choose exercises that:  You enjoy.  Fit into your day. You do not need to exercise really hard to be healthy. You can do exercises at a slow or medium level and stay healthy. You can:  Stretch before and after working out.  Try yoga, Pilates, or tai chi.  Lift weights.  Walk fast, swim, jog, run, climb stairs, bicycle, dance, or rollerskate.  Take aerobic classes. Exercises that burn about 150 calories:  Running 1  miles in 15 minutes.  Playing volleyball for 45 to 60 minutes.  Washing and waxing a car for 45 to 60  minutes.  Playing touch football for 45 minutes.  Walking 1  miles in 35 minutes.  Pushing a stroller 1  miles in 30 minutes.  Playing basketball for 30 minutes.  Raking leaves for 30 minutes.  Bicycling 5 miles in 30 minutes.  Walking 2 miles in 30 minutes.  Dancing for 30 minutes.  Shoveling snow for 15 minutes.  Swimming laps for 20 minutes.  Walking up stairs for 15 minutes.  Bicycling 4 miles in 15 minutes.  Gardening for 30 to 45 minutes.  Jumping rope for 15 minutes.  Washing windows or floors for 45 to 60 minutes. Document Released: 07/08/2010 Document Revised: 08/28/2011 Document Reviewed: 07/08/2010 ExitCare Patient Information 2013 ExitCare[employer].   Other topics ( that may be useful information):    Sexually Transmitted Disease Sexually transmitted disease (STD) refers to any infection that is passed from person to person during sexual activity. This may happen by way of saliva, semen, blood, vaginal mucus, or urine. Common STDs include:  Gonorrhea.  Chlamydia.  Syphilis.  HIV/AIDS.  Genital herpes.  Hepatitis B and C.  Trichomonas.  Human papillomavirus (HPV).  Pubic lice. CAUSES  An STD may be spread by bacteria, virus, or parasite. A person can get an STD by:  Sexual intercourse with an infected person.  Sharing sex toys with an infected person.  Sharing needles with an infected person.  Having intimate contact with the genitals, mouth, or rectal areas of an infected person. SYMPTOMS  Some people may not have any symptoms, but   they can still pass the infection to others. Different STDs have different symptoms. Symptoms include:  Painful or bloody urination.  Pain in the pelvis, abdomen, vagina, anus, throat, or eyes.  Skin rash, itching, irritation, growths, or sores (lesions). These usually occur in the genital or anal area.  Abnormal vaginal discharge.  Penile discharge in men.  Soft, flesh-colored skin growths in the  genital or anal area.  Fever.  Pain or bleeding during sexual intercourse.  Swollen glands in the groin area.  Yellow skin and eyes (jaundice). This is seen with hepatitis. DIAGNOSIS  To make a diagnosis, your caregiver may:  Take a medical history.  Perform a physical exam.  Take a specimen (culture) to be examined.  Examine a sample of discharge under a microscope.  Perform blood test TREATMENT   Chlamydia, gonorrhea, trichomonas, and syphilis can be cured with antibiotic medicine.  Genital herpes, hepatitis, and HIV can be treated, but not cured, with prescribed medicines. The medicines will lessen the symptoms.  Genital warts from HPV can be treated with medicine or by freezing, burning (electrocautery), or surgery. Warts may come back.  HPV is a virus and cannot be cured with medicine or surgery.However, abnormal areas may be followed very closely by your caregiver and may be removed from the cervix, vagina, or vulva through office procedures or surgery. If your diagnosis is confirmed, your recent sexual partners need treatment. This is true even if they are symptom-free or have a negative culture or evaluation. They should not have sex until their caregiver says it is okay. HOME CARE INSTRUCTIONS  All sexual partners should be informed, tested, and treated for all STDs.  Take your antibiotics as directed. Finish them even if you start to feel better.  Only take over-the-counter or prescription medicines for pain, discomfort, or fever as directed by your caregiver.  Rest.  Eat a balanced diet and drink enough fluids to keep your urine clear or pale yellow.  Do not have sex until treatment is completed and you have followed up with your caregiver. STDs should be checked after treatment.  Keep all follow-up appointments, Pap tests, and blood tests as directed by your caregiver.  Only use latex condoms and water-soluble lubricants during sexual activity. Do not use  petroleum jelly or oils.  Avoid alcohol and illegal drugs.  Get vaccinated for HPV and hepatitis. If you have not received these vaccines in the past, talk to your caregiver about whether one or both might be right for you.  Avoid risky sex practices that can break the skin. The only way to avoid getting an STD is to avoid all sexual activity.Latex condoms and dental dams (for oral sex) will help lessen the risk of getting an STD, but will not completely eliminate the risk. SEEK MEDICAL CARE IF:   You have a fever.  You have any new or worsening symptoms. Document Released: 08/26/2002 Document Revised: 08/28/2011 Document Reviewed: 09/02/2010 [facility] Patient Information 2013 [employer].    Domestic Abuse You are being battered or abused if someone close to you hits, pushes, or physically hurts you in any way. You also are being abused if you are forced into activities. You are being sexually abused if you are forced to have sexual contact of any kind. You are being emotionally abused if you are made to feel worthless or if you are constantly threatened. It is important to remember that help is available. No one has the right to abuse you. PREVENTION OF FURTHER  ABUSE  Learn the warning signs of danger. This varies with situations but may include: the use of alcohol, threats, isolation from friends and family, or forced sexual contact. Leave if you feel that violence is going to occur.  If you are attacked or beaten, report it to the police so the abuse is documented. You do not have to press charges. The police can protect you while you or the attackers are leaving. Get the officer's name and badge number and a copy of the report.  Find someone you can trust and tell them what is happening to you: your caregiver, a nurse, clergy member, close friend or family member. Feeling ashamed is natural, but remember that you have done nothing wrong. No one deserves abuse. Document Released:  06/02/2000 Document Revised: 08/28/2011 Document Reviewed: 08/11/2010 ExitCare Patient Information 2013 ExitCare[employer] LLC.    How Much is Too Much Alcohol? Drinking too much alcohol can cause injury, accidents, and health problems. These types of problems can include:   Car crashes.  Falls.  Family fighting (domestic violence).  Drowning.  Fights.  Injuries.  Burns.  Damage to certain organs.  Having a baby with birth defects. ONE DRINK CAN BE TOO MUCH WHEN YOU ARE:  Working.  Pregnant or breastfeeding.  Taking medicines. Ask your doctor.  Driving or planning to drive. If you or someone you know has a drinking problem, get help from a doctor.  Document Released: 04/01/2009 Document Revised: 08/28/2011 Document Reviewed: 04/01/2009 ExitCare Patient Information 2013 ExitCare[employer].   Smoking Hazards Smoking cigarettes is extremely bad for your health. Tobacco smoke has over 200 known poisons in it. There are over 60 chemicals in tobacco smoke that cause cancer. Some of the chemicals found in cigarette smoke include:   Cyanide.  Benzene.  Formaldehyde.  Methanol (wood alcohol).  Acetylene (fuel used in welding torches).  Ammonia. Cigarette smoke also contains the poisonous gases nitrogen oxide and carbon monoxide.  Cigarette smokers have an increased risk of many serious medical problems and Smoking causes approximately:  90% of all lung cancer deaths in men.  80% of all lung cancer deaths in women.  90% of deaths from chronic obstructive lung disease. Compared with nonsmokers, smoking increases the risk of:  Coronary heart disease by 2 to 4 times.  Stroke by 2 to 4 times.  Men developing lung cancer by 23 times.  Women developing lung cancer by 13 times.  Dying from chronic obstructive lung diseases by 12 times.  . Smoking is the most preventable cause of death and disease in our society.  WHY IS SMOKING ADDICTIVE?  Nicotine is the chemical  agent in tobacco that is capable of causing addiction or dependence.  When you smoke and inhale, nicotine is absorbed rapidly into the bloodstream through your lungs. Nicotine absorbed through the lungs is capable of creating a powerful addiction. Both inhaled and non-inhaled nicotine may be addictive.  Addiction studies of cigarettes and spit tobacco show that addiction to nicotine occurs mainly during the teen years, when young people begin using tobacco products. WHAT ARE THE BENEFITS OF QUITTING?  There are many health benefits to quitting smoking.   Likelihood of developing cancer and heart disease decreases. Health improvements are seen almost immediately.  Blood pressure, pulse rate, and breathing patterns start returning to normal soon after quitting. QUITTING SMOKING   American Lung Association - 1-800-LUNGUSA  American Cancer Society - 1-800-ACS-2345 Document Released: 07/13/2004 Document Revised: 08/28/2011 Document Reviewed: 03/17/2009 ExitCare Patient Information 2013 ExitCare,   LLC.   Stress Management Stress is a state of physical or mental tension that often results from changes in your life or normal routine. Some common causes of stress are:  Death of a loved one.  Injuries or severe illnesses.  Getting fired or changing jobs.  Moving into a new home. Other causes may be:  Sexual problems.  Business or financial losses.  Taking on a large debt.  Regular conflict with someone at home or at work.  Constant tiredness from lack of sleep. It is not just bad things that are stressful. It may be stressful to:  Win the lottery.  Get married.  Buy a new car. The amount of stress that can be easily tolerated varies from person to person. Changes generally cause stress, regardless of the types of change. Too much stress can affect your health. It may lead to physical or emotional problems. Too little stress (boredom) may also become stressful. SUGGESTIONS TO  REDUCE STRESS:  Talk things over with your family and friends. It often is helpful to share your concerns and worries. If you feel your problem is serious, you may want to get help from a professional counselor.  Consider your problems one at a time instead of lumping them all together. Trying to take care of everything at once may seem impossible. List all the things you need to do and then start with the most important one. Set a goal to accomplish 2 or 3 things each day. If you expect to do too many in a single day you will naturally fail, causing you to feel even more stressed.  Do not use alcohol or drugs to relieve stress. Although you may feel better for a short time, they do not remove the problems that caused the stress. They can also be habit forming.  Exercise regularly - at least 3 times per week. Physical exercise can help to relieve that "uptight" feeling and will relax you.  The shortest distance between despair and hope is often a good night's sleep.  Go to bed and get up on time allowing yourself time for appointments without being rushed.  Take a short "time-out" period from any stressful situation that occurs during the day. Close your eyes and take some deep breaths. Starting with the muscles in your face, tense them, hold it for a few seconds, then relax. Repeat this with the muscles in your neck, shoulders, hand, stomach, back and legs.  Take good care of yourself. Eat a balanced diet and get plenty of rest.  Schedule time for having fun. Take a break from your daily routine to relax. HOME CARE INSTRUCTIONS   Call if you feel overwhelmed by your problems and feel you can no longer manage them on your own.  Return immediately if you feel like hurting yourself or someone else. Document Released: 11/29/2000 Document Revised: 08/28/2011 Document Reviewed: 07/22/2007 ExitCare Patient Information 2013 ExitCare, LLC.  

## 2015-09-04 LAB — HIV ANTIBODY (ROUTINE TESTING W REFLEX): HIV 1&2 Ab, 4th Generation: NONREACTIVE

## 2015-09-04 LAB — VITAMIN D 25 HYDROXY (VIT D DEFICIENCY, FRACTURES): VIT D 25 HYDROXY: 31 ng/mL (ref 30–100)

## 2015-09-05 NOTE — Progress Notes (Signed)
Encounter reviewed by Dr. Brook Amundson C. Silva.  

## 2015-09-07 LAB — IPS PAP TEST WITH REFLEX TO HPV

## 2015-09-10 LAB — HEMOGLOBIN, FINGERSTICK: Hemoglobin, fingerstick: 14.3 g/dL (ref 12.0–16.0)

## 2015-09-21 DIAGNOSIS — M6281 Muscle weakness (generalized): Secondary | ICD-10-CM | POA: Diagnosis not present

## 2015-09-21 DIAGNOSIS — M6588 Other synovitis and tenosynovitis, other site: Secondary | ICD-10-CM | POA: Diagnosis not present

## 2015-09-21 DIAGNOSIS — S39012A Strain of muscle, fascia and tendon of lower back, initial encounter: Secondary | ICD-10-CM | POA: Diagnosis not present

## 2015-09-21 DIAGNOSIS — M25531 Pain in right wrist: Secondary | ICD-10-CM | POA: Diagnosis not present

## 2015-12-25 ENCOUNTER — Encounter: Payer: Self-pay | Admitting: Nurse Practitioner

## 2015-12-31 ENCOUNTER — Telehealth: Payer: Self-pay | Admitting: Emergency Medicine

## 2015-12-31 NOTE — Telephone Encounter (Signed)
Telephone call for triage created to discuss message with patient and disposition as appropriate.   

## 2015-12-31 NOTE — Telephone Encounter (Signed)
Patient sent mychart message and would like to stop OCP.  Last note, never sexually active.  Okay to advise she can stop?

## 2015-12-31 NOTE — Telephone Encounter (Signed)
Spoke with patient. Advised of message as seen below from Patricia Grubb, FNP. She is agreeable and verbalizes understanding.  Routing to provider for final review. Patient agreeable to disposition. Will close encounter.  

## 2015-12-31 NOTE — Telephone Encounter (Signed)
===  View-only below this line===   ----- Message -----    FromLillia [name]: [name],Dawn    Sent: 12/31/2015  7:23 AM EDT      To: Ria CommentPatricia Grubb, FNP Subject: RE: Non-Urgent Medical Question  Thank you for your quick reply! I've been taking the Microgestin since Sunday but I haven't really felt like myself - I'm nauseous and just kind of sick-feeling, and my heart rate was pretty high last night. Could this be caused by the Microgestin? I never had any side effects with Junel. That brings me to my second question - I'd been thinking about stopping the Junel to see how my migraines are off of it - if my nausea is due to the Microgestin, would it hurt me to stop taking it? I've only taken five days of pills out of the pack, but I didn't want to just stop cold without asking first! ----- Message ----- From: Melony OverlyBrook A Silva, MD Sent: 12/25/2015  7:50 PM EDT To: Dawn CorporalLauren Spurgeon Subject: RE: Non-Urgent Medical Question  Hello [name],   This is Dr. Edward JollySilva from the office reviewing your message.    The Microgestin Fe 1/20 is just fine!  Both Junel and Microgestin Fe are generics.  The brand name is LoEstrin.   We do not receive information from the pharmacies when they do not carry a particular generic or if they are out of stock.  You can ask at your pharmacy to try to get more information.   I hope this helps!  Conley SimmondsBrook Silva, MD   ----- Message -----    From: Dawn CorporalHILL,Dawn    Sent: 12/25/2015  1:49 PM EDT      To: Ria CommentPatricia Grubb, FNP Subject: Non-Urgent Medical Question  It's marked non-urgent but it kind of is urgent.   I tried to refill my prescription for Junel on Thursday and the pharmacy had to order it. I went back Friday and they still hadn't gotten it in but said it would be there Saturday. I went again today (Saturday) and they don't have it. No pharmacy I've called has it. It's like it's just not available anywhere. I got one pack of Microgestin Fe 1/20 because I'm supposed to start a new pack on  Sunday (tomorrow!) but I'm nervous to start a generic when I've only ever taken Junel.   Why can't I find Junel anywhere? Should I start the generic and hope it works for me? Switching pills makes me very nervous, especially since the original Junel has been so helpful for my migraines.   Please let me know what I should do! Thanks!

## 2015-12-31 NOTE — Telephone Encounter (Signed)
She may stop OCP even though she has taken 5 pills of this pack.  She may experience BTB for several days.

## 2016-02-03 DIAGNOSIS — H16223 Keratoconjunctivitis sicca, not specified as Sjogren's, bilateral: Secondary | ICD-10-CM | POA: Diagnosis not present

## 2016-02-03 DIAGNOSIS — H0014 Chalazion left upper eyelid: Secondary | ICD-10-CM | POA: Diagnosis not present

## 2016-03-07 DIAGNOSIS — H16223 Keratoconjunctivitis sicca, not specified as Sjogren's, bilateral: Secondary | ICD-10-CM | POA: Diagnosis not present

## 2016-03-13 DIAGNOSIS — M5412 Radiculopathy, cervical region: Secondary | ICD-10-CM | POA: Diagnosis not present

## 2016-03-21 DIAGNOSIS — R197 Diarrhea, unspecified: Secondary | ICD-10-CM | POA: Diagnosis not present

## 2016-03-23 DIAGNOSIS — M542 Cervicalgia: Secondary | ICD-10-CM | POA: Diagnosis not present

## 2016-03-25 DIAGNOSIS — M5412 Radiculopathy, cervical region: Secondary | ICD-10-CM | POA: Diagnosis not present

## 2016-04-18 DIAGNOSIS — F411 Generalized anxiety disorder: Secondary | ICD-10-CM | POA: Diagnosis not present

## 2016-04-25 DIAGNOSIS — Z23 Encounter for immunization: Secondary | ICD-10-CM | POA: Diagnosis not present

## 2016-04-27 DIAGNOSIS — F411 Generalized anxiety disorder: Secondary | ICD-10-CM | POA: Diagnosis not present

## 2016-05-02 DIAGNOSIS — F411 Generalized anxiety disorder: Secondary | ICD-10-CM | POA: Diagnosis not present

## 2016-05-10 DIAGNOSIS — M3500 Sicca syndrome, unspecified: Secondary | ICD-10-CM | POA: Diagnosis not present

## 2016-05-10 DIAGNOSIS — M50322 Other cervical disc degeneration at C5-C6 level: Secondary | ICD-10-CM | POA: Diagnosis not present

## 2016-05-10 DIAGNOSIS — R5383 Other fatigue: Secondary | ICD-10-CM | POA: Diagnosis not present

## 2016-05-15 ENCOUNTER — Telehealth: Payer: Self-pay

## 2016-05-15 ENCOUNTER — Encounter: Payer: Self-pay | Admitting: Nurse Practitioner

## 2016-05-15 DIAGNOSIS — F411 Generalized anxiety disorder: Secondary | ICD-10-CM | POA: Diagnosis not present

## 2016-05-15 MED ORDER — JUNEL FE 1/20 1-20 MG-MCG PO TABS: 1.0000 | ORAL_TABLET | Freq: Every day | ORAL | 0 refills | Status: DC

## 2016-05-15 NOTE — Telephone Encounter (Signed)
Telephone encounter created to review with Patricia Grubb, FNP. 

## 2016-05-15 NOTE — Telephone Encounter (Signed)
Left message to call Jazlyn Tippens at [phone]. 

## 2016-05-15 NOTE — Telephone Encounter (Signed)
Spoke with patient. Advised of message as seen below from Ria CommentPatricia Grubb, FNP. Patient verbalizes understanding. Rx for Junel 1/20 brand name only #4 0RF take 1 tablet daily, [facility] [address] per patient request. Patient will call toward the end of her 3rd pack to provide and update on how she is doing. Patient has not seen a Neurologist before.  Ria CommentPatricia Grubb, FNP anything further for this patient?

## 2016-05-15 NOTE — Telephone Encounter (Signed)
Non-Urgent Medical Question  Message [phone]  From [facility] Pierce[facility] To Ria CommentPatricia Grubb, FNP Sent 05/15/2016 12:04 PM  Hello! I've been off the Junel Fe since mid-July and my periods have been pretty regular, but I noticed for the past couple of months that I'm getting more frequent migraines. When I was on Junel, I had a migraine for one day during the week of my period, fairly reliably on that Wednesday or Thursday. For the past two or three months, I've gotten a migraine two weeks before my cycle (so, about halfway) and then again either just before or just after it starts. I had a migraine last week, another one Saturday, then I woke up Sunday with my period. Would going back on the Junel potentially get me back to just getting one migraine a month? If so, I'd like to get back on it (the name brand, definitely not the generic!). Would I need to come in?   Responsible Party   Pool - [facility] Pool No one has taken responsibility for this message.  No actions have been taken on this message.   Routing to Ria CommentPatricia Grubb, FNP for review and advise.

## 2016-05-15 NOTE — Telephone Encounter (Signed)
She does need to go back on OCP - brand Junel 1/20 but I want her to try continuous active pills for 3 months then call back with a progress report as to number and severity of HA's.  See who she has seen for Neurologist in the past.

## 2016-05-15 NOTE — Telephone Encounter (Signed)
If she continues with HA's will get a referral to see Neuro.

## 2016-05-15 NOTE — Telephone Encounter (Signed)
Patient returned call

## 2016-05-16 NOTE — Telephone Encounter (Signed)
Spoke with patient. Advised of message as seen below from Patricia Grubb, FNP. Patient is agreeable and verbalizes understanding.  Routing to provider for final review. Patient agreeable to disposition. Will close encounter.  

## 2016-05-22 DIAGNOSIS — F411 Generalized anxiety disorder: Secondary | ICD-10-CM | POA: Diagnosis not present

## 2016-06-02 DIAGNOSIS — F411 Generalized anxiety disorder: Secondary | ICD-10-CM | POA: Diagnosis not present

## 2016-06-16 DIAGNOSIS — F411 Generalized anxiety disorder: Secondary | ICD-10-CM | POA: Diagnosis not present

## 2016-06-19 DIAGNOSIS — M3500 Sicca syndrome, unspecified: Secondary | ICD-10-CM

## 2016-06-19 HISTORY — DX: Sjogren syndrome, unspecified: M35.00

## 2016-07-20 ENCOUNTER — Encounter: Payer: Self-pay | Admitting: Nurse Practitioner

## 2016-07-24 ENCOUNTER — Ambulatory Visit (INDEPENDENT_AMBULATORY_CARE_PROVIDER_SITE_OTHER): Payer: BLUE CROSS/BLUE SHIELD | Admitting: Nurse Practitioner

## 2016-07-24 ENCOUNTER — Encounter: Payer: Self-pay | Admitting: Nurse Practitioner

## 2016-07-24 VITALS — BP 120/80 | HR 60 | Resp 12 | Ht 65.25 in | Wt 125.8 lb

## 2016-07-24 DIAGNOSIS — N6320 Unspecified lump in the left breast, unspecified quadrant: Secondary | ICD-10-CM | POA: Diagnosis not present

## 2016-07-24 NOTE — Patient Instructions (Signed)
Apt will be made for US and possible aspiration of cyst.

## 2016-07-24 NOTE — Progress Notes (Signed)
26 y.o. G0P0 Single  Caucasian Fe here for left breast pain.  Noticed about 2-3 weeks ago. Tenderness and pain to touch and movement. "Stabbing" sensation some.   Moved a fireplace about 3 weeks ago and pain was noted afterwards.  No other trama.  LMP: 3 months ago - takes continues active pills. This is the week she would be off to have another cycle.   She has had less HA's.  MGM age 61 with history of breast and died in her 74's of recurrence with metasis. PGF had colon cancer age 26.  Pt had MRI of cervical neck showing degenerative disc@ C 5-6.  Patient's last menstrual period was 04/23/2016 (approximate).          Sexually active: No.  The current method of family planning is OCP (estrogen/progesterone).    Exercising: Yes.    Walk, bike Smoker:  no  Health Maintenance: Pap:  09/03/15 Pap smear negative; Endometrial cells present  TDaP:  2008 HIV: done 09/03/15 Labs:    reports that she has never smoked. She has never used smokeless tobacco. She reports that she drinks about 0.6 oz of alcohol per week . She reports that she does not use drugs.  Past Medical History:  Diagnosis Date  . Fracture of left foot 2007   treated with brace  . Migraine    better with OCP  . Oligomenorrhea    better on OCP    Past Surgical History:  Procedure Laterality Date  . fishbone removal     pt swallowed fishbone & had to be surgically removed  . IRRIGATION AND DEBRIDEMENT SEBACEOUS CYST  08/2012   right shoulder area  . WISDOM TOOTH EXTRACTION      Current Outpatient Prescriptions  Medication Sig Dispense Refill  . JUNEL FE 1/20 1-20 MG-MCG tablet Take 1 tablet by mouth daily. Takes continuous active pills. 4 Package 0  . L-THEANINE PO Take by mouth as needed.    . Loratadine 10 MG CAPS Take by mouth.    . ranitidine (ZANTAC) 75 MG tablet Take 75 mg by mouth as needed for heartburn.     No current facility-administered medications for this visit.     Family History  Problem Relation Age  of Onset  . Hyperlipidemia Mother   . Hypertension Mother   . Breast cancer Maternal Grandmother 40  . Parkinson's disease Paternal Grandmother   . Colon cancer Paternal Grandfather 39    age 78 in 2018    ROS:  Pertinent items are noted in HPI.  Otherwise, a comprehensive ROS was negative.  Exam:   BP 120/80 (BP Location: Right Arm, Patient Position: Sitting, Cuff Size: Normal)   Pulse 60   Resp 12   Ht 5' 5.25" (1.657 m)   Wt 125 lb 12.8 oz (57.1 kg)   LMP 04/23/2016 (Approximate) Comment: Continuous Birht Control  BMI 20.77 kg/m  Height: 5' 5.25" (165.7 cm) Ht Readings from Last 3 Encounters:  07/24/16 5' 5.25" (1.657 m)  09/03/15 5' 5.25" (1.657 m)  08/20/14 5' 4.75" (1.645 m)    General appearance: alert, cooperative and appears stated age Neck: no adenopathy Lungs: clear to auscultation bilaterally Breasts: normal appearance, no masses or tenderness, positive findings: fibrocystic changes and on the left breast at 3-4 o'clock position there is a mobile round nodule that appears to be a cyst.  No lymph nodes and area is tender to touch.  May need aspiration. Heart: regular rate and rhythm Abdomen: soft, non-tender;  no masses,  no organomegaly Skin: Skin color, texture, turgor normal. No rashes or lesions Lymph nodes: Cervical, supraclavicular normal Neurologic: Grossly normal   A:  Left breast cyst that may need aspiration  P:   Will get left breast US and follow - if needs aspiration then can be done at [facility].

## 2016-07-24 NOTE — Progress Notes (Signed)
Spoke with Dawn [name]. Patient scheduled at [facility] while in office for left breast ultrasound and aspiration. Patient scheduled 07/25/16 arriving at 0740 for 0800 appointment. Patient is agreeable to date and time.

## 2016-07-25 ENCOUNTER — Encounter: Payer: Self-pay | Admitting: Nurse Practitioner

## 2016-07-25 ENCOUNTER — Telehealth: Payer: Self-pay | Admitting: *Deleted

## 2016-07-25 ENCOUNTER — Ambulatory Visit
Admission: RE | Admit: 2016-07-25 | Discharge: 2016-07-25 | Disposition: A | Discharge status: Home / Self Care | Payer: Self-pay | Source: Ambulatory Visit | Attending: Nurse Practitioner | Admitting: Nurse Practitioner

## 2016-07-25 DIAGNOSIS — N6489 Other specified disorders of breast: Secondary | ICD-10-CM | POA: Diagnosis not present

## 2016-07-25 DIAGNOSIS — N63 Unspecified lump in unspecified breast: Secondary | ICD-10-CM

## 2016-07-25 DIAGNOSIS — N6320 Unspecified lump in the left breast, unspecified quadrant: Secondary | ICD-10-CM

## 2016-07-25 HISTORY — DX: Unspecified lump in unspecified breast: N63.0

## 2016-07-25 NOTE — Telephone Encounter (Signed)
OK for her AEX to recheck on breast.  No conclusive evidence that Vit E helps with breast pain but if used should be at 400 - 800 IU daily.

## 2016-07-25 NOTE — Progress Notes (Signed)
Encounter reviewed by Dr. Ruchama Kubicek Amundson C. Silva.  

## 2016-07-25 NOTE — Telephone Encounter (Signed)
See telephone encounter dated 07/25/16.

## 2016-07-25 NOTE — Telephone Encounter (Signed)
Spoke with patient. Patient is scheduled for AEX 09/08/16. Patient states this will be right after menses. Patient states she also sent MyChart message about taking Vit E. Patient would like to know if ok to take and dosage? Advised patient would review with Ria CommentPatricia Grubb, NP and return call with recommendations, patient is agreeable.  Ria CommentPatricia Grubb, NP -ok to wait for AEX for f/u? Please advise on Vit E?   From [facility] To Ria CommentPatricia Grubb, FNP Sent 07/25/2016 9:10 AM  Hello! I just had my ultrasound (they said they'd send the results to your office). Nothing to be done but wait it out. They did suggest taking vitamin e but wanted me to check with my doctor to make sure it's okay for me to take. Can I take it?

## 2016-07-25 NOTE — Telephone Encounter (Signed)
-----   Message from Ria CommentPatricia Grubb, FNP sent at 07/25/2016 12:43 PM EST ----- Have pt to return for a breast check here in about month after her menses.  She may be taken out of hold.

## 2016-07-26 NOTE — Telephone Encounter (Signed)
Spoke with patient, advised as seen below per Patricia Grubb, NP. Patient verbalizes understanding and is agreeable.  Routing to provider for final review. Patient is agreeable to disposition. Will close encounter.  

## 2016-08-14 ENCOUNTER — Encounter: Payer: Self-pay | Admitting: Nurse Practitioner

## 2016-08-14 NOTE — Telephone Encounter (Signed)
Medication refill request: junel  Last AEX:  09/03/15 PG Next AEX: 09/08/16 PG Last MMG (if hormonal medication request): none Refill authorized: 05/15/16 #4packs/0R. Today #4packs/0R?

## 2016-08-15 MED ORDER — JUNEL FE 1/20 1-20 MG-MCG PO TABS: 1.0000 | ORAL_TABLET | Freq: Every day | ORAL | 0 refills | Status: DC

## 2016-08-15 NOTE — Telephone Encounter (Signed)
[facility] [address] per patient's request.

## 2016-08-21 DIAGNOSIS — L309 Dermatitis, unspecified: Secondary | ICD-10-CM | POA: Diagnosis not present

## 2016-08-21 DIAGNOSIS — L720 Epidermal cyst: Secondary | ICD-10-CM | POA: Diagnosis not present

## 2016-08-21 DIAGNOSIS — L308 Other specified dermatitis: Secondary | ICD-10-CM | POA: Diagnosis not present

## 2016-08-28 ENCOUNTER — Encounter: Payer: Self-pay | Admitting: Nurse Practitioner

## 2016-08-29 ENCOUNTER — Telehealth: Payer: Self-pay

## 2016-08-29 NOTE — Telephone Encounter (Signed)
She make take Fish Oil 1000 mg daily.

## 2016-08-29 NOTE — Telephone Encounter (Signed)
Telephone encounter created to review with Patricia Grubb, FNP. 

## 2016-08-29 NOTE — Telephone Encounter (Signed)
Non-Urgent Medical Question  Message [phone]  From [facility] To Ria CommentPatricia Grubb, FNP Sent 08/28/2016 10:35 PM  I'd like to start taking fish oil. What dosage should I take, and will it interact at all with the Junel? Thanks!   Responsible Party   Pool - [facility] Clinical Pool Message taken by Ginny ForthKaitlyn E Satine Hausner, RN on 08/29/2016 12:35 PM  No actions have been taken on this message.   Ria CommentPatricia Grubb, FNP okay to advise this is safe to take with OCP as there is no interaction? How much should the patient take daily?

## 2016-08-30 NOTE — Telephone Encounter (Signed)
Left message to call Sheron Tallman at [phone].  MyChart message also sent with information provided by Ria CommentPatricia Grubb, FNP as seen below.

## 2016-08-31 NOTE — Telephone Encounter (Signed)
Patient read MyChart message at 8:30 AM on 08/30/2016.   Routing to provider for final review. Patient agreeable to disposition. Will close encounter.

## 2016-09-08 ENCOUNTER — Ambulatory Visit (INDEPENDENT_AMBULATORY_CARE_PROVIDER_SITE_OTHER): Payer: BLUE CROSS/BLUE SHIELD | Admitting: Nurse Practitioner

## 2016-09-08 ENCOUNTER — Encounter: Payer: Self-pay | Admitting: Nurse Practitioner

## 2016-09-08 VITALS — BP 110/80 | HR 84 | Resp 16 | Ht 65.0 in | Wt 127.0 lb

## 2016-09-08 DIAGNOSIS — A64 Unspecified sexually transmitted disease: Secondary | ICD-10-CM | POA: Diagnosis not present

## 2016-09-08 DIAGNOSIS — Z0000 Encounter for general adult medical examination without abnormal findings: Secondary | ICD-10-CM | POA: Diagnosis not present

## 2016-09-08 DIAGNOSIS — Z01419 Encounter for gynecological examination (general) (routine) without abnormal findings: Secondary | ICD-10-CM

## 2016-09-08 MED ORDER — JUNEL FE 1/20 1-20 MG-MCG PO TABS: 1.0000 | ORAL_TABLET | Freq: Every day | ORAL | 4 refills | Status: DC

## 2016-09-08 NOTE — Patient Instructions (Signed)
General topics  Next pap or exam is  due in 1 year Take a Women's multivitamin Take 1200 mg. of calcium daily - prefer dietary If any concerns in interim to call back  Breast Self-Awareness Practicing breast self-awareness may pick up problems early, prevent significant medical complications, and possibly save your life. By practicing breast self-awareness, you can become familiar with how your breasts look and feel and if your breasts are changing. This allows you to notice changes early. It can also offer you some reassurance that your breast health is good. One way to learn what is normal for your breasts and whether your breasts are changing is to do a breast self-exam. If you find a lump or something that was not present in the past, it is best to contact your caregiver right away. Other findings that should be evaluated by your caregiver include nipple discharge, especially if it is bloody; skin changes or reddening; areas where the skin seems to be pulled in (retracted); or new lumps and bumps. Breast pain is seldom associated with cancer (malignancy), but should also be evaluated by a caregiver. BREAST SELF-EXAM The best time to examine your breasts is 5 7 days after your menstrual period is over.  ExitCare Patient Information 2013 [employer].   Exercise to Stay Healthy Exercise helps you become and stay healthy. EXERCISE IDEAS AND TIPS Choose exercises that:  You enjoy.  Fit into your day. You do not need to exercise really hard to be healthy. You can do exercises at a slow or medium level and stay healthy. You can:  Stretch before and after working out.  Try yoga, Pilates, or tai chi.  Lift weights.  Walk fast, swim, jog, run, climb stairs, bicycle, dance, or rollerskate.  Take aerobic classes. Exercises that burn about 150 calories:  Running 1  miles in 15 minutes.  Playing volleyball for 45 to 60 minutes.  Washing and waxing a car for 45 to 60  minutes.  Playing touch football for 45 minutes.  Walking 1  miles in 35 minutes.  Pushing a stroller 1  miles in 30 minutes.  Playing basketball for 30 minutes.  Raking leaves for 30 minutes.  Bicycling 5 miles in 30 minutes.  Walking 2 miles in 30 minutes.  Dancing for 30 minutes.  Shoveling snow for 15 minutes.  Swimming laps for 20 minutes.  Walking up stairs for 15 minutes.  Bicycling 4 miles in 15 minutes.  Gardening for 30 to 45 minutes.  Jumping rope for 15 minutes.  Washing windows or floors for 45 to 60 minutes. Document Released: 07/08/2010 Document Revised: 08/28/2011 Document Reviewed: 07/08/2010 ExitCare Patient Information 2013 ExitCare[employer].   Other topics ( that may be useful information):    Sexually Transmitted Disease Sexually transmitted disease (STD) refers to any infection that is passed from person to person during sexual activity. This may happen by way of saliva, semen, blood, vaginal mucus, or urine. Common STDs include:  Gonorrhea.  Chlamydia.  Syphilis.  HIV/AIDS.  Genital herpes.  Hepatitis B and C.  Trichomonas.  Human papillomavirus (HPV).  Pubic lice. CAUSES  An STD may be spread by bacteria, virus, or parasite. A person can get an STD by:  Sexual intercourse with an infected person.  Sharing sex toys with an infected person.  Sharing needles with an infected person.  Having intimate contact with the genitals, mouth, or rectal areas of an infected person. SYMPTOMS  Some people may not have any symptoms, but   they can still pass the infection to others. Different STDs have different symptoms. Symptoms include:  Painful or bloody urination.  Pain in the pelvis, abdomen, vagina, anus, throat, or eyes.  Skin rash, itching, irritation, growths, or sores (lesions). These usually occur in the genital or anal area.  Abnormal vaginal discharge.  Penile discharge in men.  Soft, flesh-colored skin growths in the  genital or anal area.  Fever.  Pain or bleeding during sexual intercourse.  Swollen glands in the groin area.  Yellow skin and eyes (jaundice). This is seen with hepatitis. DIAGNOSIS  To make a diagnosis, your caregiver may:  Take a medical history.  Perform a physical exam.  Take a specimen (culture) to be examined.  Examine a sample of discharge under a microscope.  Perform blood test TREATMENT   Chlamydia, gonorrhea, trichomonas, and syphilis can be cured with antibiotic medicine.  Genital herpes, hepatitis, and HIV can be treated, but not cured, with prescribed medicines. The medicines will lessen the symptoms.  Genital warts from HPV can be treated with medicine or by freezing, burning (electrocautery), or surgery. Warts may come back.  HPV is a virus and cannot be cured with medicine or surgery.However, abnormal areas may be followed very closely by your caregiver and may be removed from the cervix, vagina, or vulva through office procedures or surgery. If your diagnosis is confirmed, your recent sexual partners need treatment. This is true even if they are symptom-free or have a negative culture or evaluation. They should not have sex until their caregiver says it is okay. HOME CARE INSTRUCTIONS  All sexual partners should be informed, tested, and treated for all STDs.  Take your antibiotics as directed. Finish them even if you start to feel better.  Only take over-the-counter or prescription medicines for pain, discomfort, or fever as directed by your caregiver.  Rest.  Eat a balanced diet and drink enough fluids to keep your urine clear or pale yellow.  Do not have sex until treatment is completed and you have followed up with your caregiver. STDs should be checked after treatment.  Keep all follow-up appointments, Pap tests, and blood tests as directed by your caregiver.  Only use latex condoms and water-soluble lubricants during sexual activity. Do not use  petroleum jelly or oils.  Avoid alcohol and illegal drugs.  Get vaccinated for HPV and hepatitis. If you have not received these vaccines in the past, talk to your caregiver about whether one or both might be right for you.  Avoid risky sex practices that can break the skin. The only way to avoid getting an STD is to avoid all sexual activity.Latex condoms and dental dams (for oral sex) will help lessen the risk of getting an STD, but will not completely eliminate the risk. SEEK MEDICAL CARE IF:   You have a fever.  You have any new or worsening symptoms. Document Released: 08/26/2002 Document Revised: 08/28/2011 Document Reviewed: 09/02/2010 [facility] Patient Information 2013 [employer].    Domestic Abuse You are being battered or abused if someone close to you hits, pushes, or physically hurts you in any way. You also are being abused if you are forced into activities. You are being sexually abused if you are forced to have sexual contact of any kind. You are being emotionally abused if you are made to feel worthless or if you are constantly threatened. It is important to remember that help is available. No one has the right to abuse you. PREVENTION OF FURTHER  ABUSE  Learn the warning signs of danger. This varies with situations but may include: the use of alcohol, threats, isolation from friends and family, or forced sexual contact. Leave if you feel that violence is going to occur.  If you are attacked or beaten, report it to the police so the abuse is documented. You do not have to press charges. The police can protect you while you or the attackers are leaving. Get the officer's name and badge number and a copy of the report.  Find someone you can trust and tell them what is happening to you: your caregiver, a nurse, clergy member, close friend or family member. Feeling ashamed is natural, but remember that you have done nothing wrong. No one deserves abuse. Document Released:  06/02/2000 Document Revised: 08/28/2011 Document Reviewed: 08/11/2010 ExitCare Patient Information 2013 ExitCare[employer] LLC.    How Much is Too Much Alcohol? Drinking too much alcohol can cause injury, accidents, and health problems. These types of problems can include:   Car crashes.  Falls.  Family fighting (domestic violence).  Drowning.  Fights.  Injuries.  Burns.  Damage to certain organs.  Having a baby with birth defects. ONE DRINK CAN BE TOO MUCH WHEN YOU ARE:  Working.  Pregnant or breastfeeding.  Taking medicines. Ask your doctor.  Driving or planning to drive. If you or someone you know has a drinking problem, get help from a doctor.  Document Released: 04/01/2009 Document Revised: 08/28/2011 Document Reviewed: 04/01/2009 ExitCare Patient Information 2013 ExitCare[employer].   Smoking Hazards Smoking cigarettes is extremely bad for your health. Tobacco smoke has over 200 known poisons in it. There are over 60 chemicals in tobacco smoke that cause cancer. Some of the chemicals found in cigarette smoke include:   Cyanide.  Benzene.  Formaldehyde.  Methanol (wood alcohol).  Acetylene (fuel used in welding torches).  Ammonia. Cigarette smoke also contains the poisonous gases nitrogen oxide and carbon monoxide.  Cigarette smokers have an increased risk of many serious medical problems and Smoking causes approximately:  90% of all lung cancer deaths in men.  80% of all lung cancer deaths in women.  90% of deaths from chronic obstructive lung disease. Compared with nonsmokers, smoking increases the risk of:  Coronary heart disease by 2 to 4 times.  Stroke by 2 to 4 times.  Men developing lung cancer by 23 times.  Women developing lung cancer by 13 times.  Dying from chronic obstructive lung diseases by 12 times.  . Smoking is the most preventable cause of death and disease in our society.  WHY IS SMOKING ADDICTIVE?  Nicotine is the chemical  agent in tobacco that is capable of causing addiction or dependence.  When you smoke and inhale, nicotine is absorbed rapidly into the bloodstream through your lungs. Nicotine absorbed through the lungs is capable of creating a powerful addiction. Both inhaled and non-inhaled nicotine may be addictive.  Addiction studies of cigarettes and spit tobacco show that addiction to nicotine occurs mainly during the teen years, when young people begin using tobacco products. WHAT ARE THE BENEFITS OF QUITTING?  There are many health benefits to quitting smoking.   Likelihood of developing cancer and heart disease decreases. Health improvements are seen almost immediately.  Blood pressure, pulse rate, and breathing patterns start returning to normal soon after quitting. QUITTING SMOKING   American Lung Association - 1-800-LUNGUSA  American Cancer Society - 1-800-ACS-2345 Document Released: 07/13/2004 Document Revised: 08/28/2011 Document Reviewed: 03/17/2009 ExitCare Patient Information 2013 ExitCare,   LLC.   Stress Management Stress is a state of physical or mental tension that often results from changes in your life or normal routine. Some common causes of stress are:  Death of a loved one.  Injuries or severe illnesses.  Getting fired or changing jobs.  Moving into a new home. Other causes may be:  Sexual problems.  Business or financial losses.  Taking on a large debt.  Regular conflict with someone at home or at work.  Constant tiredness from lack of sleep. It is not just bad things that are stressful. It may be stressful to:  Win the lottery.  Get married.  Buy a new car. The amount of stress that can be easily tolerated varies from person to person. Changes generally cause stress, regardless of the types of change. Too much stress can affect your health. It may lead to physical or emotional problems. Too little stress (boredom) may also become stressful. SUGGESTIONS TO  REDUCE STRESS:  Talk things over with your family and friends. It often is helpful to share your concerns and worries. If you feel your problem is serious, you may want to get help from a professional counselor.  Consider your problems one at a time instead of lumping them all together. Trying to take care of everything at once may seem impossible. List all the things you need to do and then start with the most important one. Set a goal to accomplish 2 or 3 things each day. If you expect to do too many in a single day you will naturally fail, causing you to feel even more stressed.  Do not use alcohol or drugs to relieve stress. Although you may feel better for a short time, they do not remove the problems that caused the stress. They can also be habit forming.  Exercise regularly - at least 3 times per week. Physical exercise can help to relieve that "uptight" feeling and will relax you.  The shortest distance between despair and hope is often a good night's sleep.  Go to bed and get up on time allowing yourself time for appointments without being rushed.  Take a short "time-out" period from any stressful situation that occurs during the day. Close your eyes and take some deep breaths. Starting with the muscles in your face, tense them, hold it for a few seconds, then relax. Repeat this with the muscles in your neck, shoulders, hand, stomach, back and legs.  Take good care of yourself. Eat a balanced diet and get plenty of rest.  Schedule time for having fun. Take a break from your daily routine to relax. HOME CARE INSTRUCTIONS   Call if you feel overwhelmed by your problems and feel you can no longer manage them on your own.  Return immediately if you feel like hurting yourself or someone else. Document Released: 11/29/2000 Document Revised: 08/28/2011 Document Reviewed: 07/22/2007 ExitCare Patient Information 2013 ExitCare, LLC.  

## 2016-09-08 NOTE — Progress Notes (Signed)
26 y.o. G0P0 Single  Caucasian Fe here for annual exam. Menses off OCP was normal.  [facility] then restarted in November.  Some increase with HA's.   She takes continuous active pills.   This past cycle spotted for a few days  Then had light flow for 1.5 weeks after restarting a new pack.  No missed or late pills.  She did have some adjustment to OCP when she first started the pill.  Now dating but not SA yet.  They have discussed marriage. He is 32.  US on left breast mass was a cluster of FCB changes last year. This area is tender at menses or if increase in caffeine.  Patient's last menstrual period was 07/26/2016 (approximate).          Sexually active: No.  The current method of family planning is OCP (estrogen/progesterone).    Exercising: Yes.    walking during the warm weather Smoker:  no  Health Maintenance: Pap:  09/03/15 PAP negative           06-17-12 neg TDaP:  2008 HIV: Done 09/03/15 Labs: patient would like to return for fasting labs   reports that she has never smoked. She has never used smokeless tobacco. She reports that she drinks about 0.6 oz of alcohol per week . She reports that she does not use drugs.  Past Medical History:  Diagnosis Date  . Breast mass 07/25/2016   Pain with lump  . Fracture of left foot 2007   treated with brace  . Migraine    better with OCP  . Oligomenorrhea    better on OCP    Past Surgical History:  Procedure Laterality Date  . fishbone removal     pt swallowed fishbone & had to be surgically removed  . IRRIGATION AND DEBRIDEMENT SEBACEOUS CYST  08/2012   right shoulder area  . WISDOM TOOTH EXTRACTION      Current Outpatient Prescriptions  Medication Sig Dispense Refill  . JUNEL FE 1/20 1-20 MG-MCG tablet Take 1 tablet by mouth daily. Takes continuous active pills. 1 Package 0  . L-THEANINE PO Take by mouth as needed.    . Loratadine 10 MG CAPS Take by mouth.    . ranitidine (ZANTAC) 75 MG tablet Take  75 mg by mouth as needed for heartburn.     No current facility-administered medications for this visit.     Family History  Problem Relation Age of Onset  . Hyperlipidemia Mother   . Hypertension Mother   . Breast cancer Maternal Grandmother 40  . Parkinson's disease Paternal Grandmother   . Colon cancer Paternal Grandfather [id number]    age 28 in 2018    ROS:  Pertinent items are noted in HPI.  Otherwise, a comprehensive ROS was negative.  Exam:   BP 110/80 (BP Location: Right Arm, Patient Position: Sitting, Cuff Size: Normal)   Pulse 84   Resp 16   Ht 5\' 5"  (1.651 m)   Wt 127 lb (57.6 kg)   LMP 07/26/2016 (Approximate)   BMI 21.13 kg/m  Height: 5\' 5"  (165.1 cm) Ht Readings from Last 3 Encounters:  09/08/16 5\' 5"  (1.651 m)  07/24/16 5' 5.25" (1.657 m)  09/03/15 5' 5.25" (1.657 m)    General appearance: alert, cooperative and appears stated age Head: Normocephalic, without obvious abnormality, atraumatic Neck: no adenopathy, supple, symmetrical, trachea midline and thyroid normal to inspection and palpation Lungs: clear to auscultation bilaterally Breasts:  normal appearance, no masses or tenderness, positive findings: fibrocystic changes Heart: regular rate and rhythm Abdomen: soft, non-tender; no masses,  no organomegaly Extremities: extremities normal, atraumatic, no cyanosis or edema Skin: Skin color, texture, turgor normal. No rashes or lesions Lymph nodes: Cervical, supraclavicular, and axillary nodes normal. No abnormal inguinal nodes palpated Neurologic: Grossly normal   Pelvic: External genitalia:  no lesions              Urethra:  normal appearing urethra with no masses, tenderness or lesions              Bartholin's and Skene's: normal                 Vagina: normal appearing vagina with normal color and discharge, no lesions              Cervix: anteverted              Pap taken: No. Bimanual Exam:  Uterus:  normal size, contour, position, consistency,  mobility, non-tender              Adnexa: no mass, fullness, tenderness               Rectovaginal: Confirms               Anus:  normal sphincter tone, no lesions  Chaperone present: yes  A:  Well Woman with normal exam   History of irregular menses and headaches - better on OCP (took a break from July - Nov) Never SA - but considering in the near future History of anxiety FMH: high cholesterol  STD screening   P:   Reviewed health and wellness pertinent to exam  Pap smear not done  Will return for fasting labs and will do STD screen as well  Will do GC and Chlamydia today  Refill on OCP for a year - continuous active pill with a cycle every 3 months  Counseled on breast self exam, STD prevention, HIV risk factors and prevention, use and side effects of OCP's, adequate intake of calcium and vitamin D, diet and exercise return annually or prn  An After Visit Summary was printed and given to the patient.

## 2016-09-09 LAB — GC/CHLAMYDIA PROBE AMP
CT PROBE, AMP APTIMA: NOT DETECTED
GC PROBE AMP APTIMA: NOT DETECTED

## 2016-09-11 NOTE — Progress Notes (Signed)
Encounter reviewed by Dr. Tailyn Hantz Amundson C. Silva.  

## 2016-09-12 ENCOUNTER — Ambulatory Visit (INDEPENDENT_AMBULATORY_CARE_PROVIDER_SITE_OTHER): Payer: BLUE CROSS/BLUE SHIELD

## 2016-09-12 VITALS — BP 120/80 | HR 84 | Resp 16 | Ht 65.5 in | Wt 127.0 lb

## 2016-09-12 DIAGNOSIS — Z0000 Encounter for general adult medical examination without abnormal findings: Secondary | ICD-10-CM

## 2016-09-12 DIAGNOSIS — Z23 Encounter for immunization: Secondary | ICD-10-CM

## 2016-09-12 DIAGNOSIS — A64 Unspecified sexually transmitted disease: Secondary | ICD-10-CM

## 2016-09-12 LAB — LIPID PANEL
CHOL/HDL RATIO: 3.2 ratio (ref <5.0)
Cholesterol: 187 mg/dL (ref <200)
HDL: 59 mg/dL (ref >50)
LDL CALC: 105 mg/dL — AB (ref <100)
Triglycerides: 117 mg/dL (ref <150)
VLDL: 23 mg/dL (ref <30)

## 2016-09-12 LAB — COMPREHENSIVE METABOLIC PANEL
ALT: 8 U/L (ref 6–29)
AST: 16 U/L (ref 10–30)
Albumin: 4.3 g/dL (ref 3.6–5.1)
Alkaline Phosphatase: 31 U/L — ABNORMAL LOW (ref 33–115)
BUN: 11 mg/dL (ref 7–25)
CHLORIDE: 102 mmol/L (ref 98–110)
CO2: 25 mmol/L (ref 20–31)
CREATININE: 0.72 mg/dL (ref 0.50–1.10)
Calcium: 9.6 mg/dL (ref 8.6–10.2)
GLUCOSE: 78 mg/dL (ref 65–99)
POTASSIUM: 4.6 mmol/L (ref 3.5–5.3)
SODIUM: 136 mmol/L (ref 135–146)
Total Bilirubin: 0.7 mg/dL (ref 0.2–1.2)
Total Protein: 7.4 g/dL (ref 6.1–8.1)

## 2016-09-12 LAB — TSH: TSH: 4.12 m[IU]/L

## 2016-09-12 NOTE — Progress Notes (Signed)
Patient here for Tdap.   Patient feels good today.   Routed to provider and encounter closed.

## 2016-09-13 LAB — STD PANEL
HEP B S AG: NEGATIVE
HIV 1&2 Ab, 4th Generation: NONREACTIVE

## 2016-09-13 LAB — VITAMIN D 25 HYDROXY (VIT D DEFICIENCY, FRACTURES): VIT D 25 HYDROXY: 28 ng/mL — AB (ref 30–100)

## 2016-11-08 ENCOUNTER — Encounter: Payer: Self-pay | Admitting: Nurse Practitioner

## 2016-11-28 DIAGNOSIS — M542 Cervicalgia: Secondary | ICD-10-CM | POA: Diagnosis not present

## 2016-11-28 DIAGNOSIS — R293 Abnormal posture: Secondary | ICD-10-CM | POA: Diagnosis not present

## 2016-11-28 DIAGNOSIS — M545 Low back pain: Secondary | ICD-10-CM | POA: Diagnosis not present

## 2016-11-28 DIAGNOSIS — M6250 Muscle wasting and atrophy, not elsewhere classified, unspecified site: Secondary | ICD-10-CM | POA: Diagnosis not present

## 2016-11-30 DIAGNOSIS — R293 Abnormal posture: Secondary | ICD-10-CM | POA: Diagnosis not present

## 2016-11-30 DIAGNOSIS — M545 Low back pain: Secondary | ICD-10-CM | POA: Diagnosis not present

## 2016-11-30 DIAGNOSIS — M6250 Muscle wasting and atrophy, not elsewhere classified, unspecified site: Secondary | ICD-10-CM | POA: Diagnosis not present

## 2016-11-30 DIAGNOSIS — M542 Cervicalgia: Secondary | ICD-10-CM | POA: Diagnosis not present

## 2016-12-05 DIAGNOSIS — M542 Cervicalgia: Secondary | ICD-10-CM | POA: Diagnosis not present

## 2016-12-05 DIAGNOSIS — R293 Abnormal posture: Secondary | ICD-10-CM | POA: Diagnosis not present

## 2016-12-05 DIAGNOSIS — M6250 Muscle wasting and atrophy, not elsewhere classified, unspecified site: Secondary | ICD-10-CM | POA: Diagnosis not present

## 2016-12-05 DIAGNOSIS — M545 Low back pain: Secondary | ICD-10-CM | POA: Diagnosis not present

## 2016-12-07 DIAGNOSIS — M6250 Muscle wasting and atrophy, not elsewhere classified, unspecified site: Secondary | ICD-10-CM | POA: Diagnosis not present

## 2016-12-07 DIAGNOSIS — R293 Abnormal posture: Secondary | ICD-10-CM | POA: Diagnosis not present

## 2016-12-07 DIAGNOSIS — M542 Cervicalgia: Secondary | ICD-10-CM | POA: Diagnosis not present

## 2016-12-07 DIAGNOSIS — M545 Low back pain: Secondary | ICD-10-CM | POA: Diagnosis not present

## 2016-12-15 ENCOUNTER — Encounter: Payer: Self-pay | Admitting: Nurse Practitioner

## 2016-12-19 DIAGNOSIS — R293 Abnormal posture: Secondary | ICD-10-CM | POA: Diagnosis not present

## 2016-12-19 DIAGNOSIS — M545 Low back pain: Secondary | ICD-10-CM | POA: Diagnosis not present

## 2016-12-19 DIAGNOSIS — M542 Cervicalgia: Secondary | ICD-10-CM | POA: Diagnosis not present

## 2016-12-19 DIAGNOSIS — M6250 Muscle wasting and atrophy, not elsewhere classified, unspecified site: Secondary | ICD-10-CM | POA: Diagnosis not present

## 2017-01-09 DIAGNOSIS — H6011 Cellulitis of right external ear: Secondary | ICD-10-CM | POA: Diagnosis not present

## 2017-01-10 DIAGNOSIS — H60391 Other infective otitis externa, right ear: Secondary | ICD-10-CM | POA: Diagnosis not present

## 2017-01-12 ENCOUNTER — Encounter: Payer: Self-pay | Admitting: Nurse Practitioner

## 2017-01-12 NOTE — Telephone Encounter (Signed)
Routing to provider for review. Encounter closed.  

## 2017-01-16 ENCOUNTER — Telehealth: Payer: Self-pay | Admitting: *Deleted

## 2017-01-16 NOTE — Telephone Encounter (Signed)
Left message to call Lashaundra Lehrmann at [phone].  

## 2017-01-16 NOTE — Telephone Encounter (Signed)
Patient says she received a mychart message to come in for a pregnancy test.

## 2017-01-16 NOTE — Telephone Encounter (Signed)
Spoke with patient, calling in f/u to MyChart message dated 01/12/17. Missed cycle on OCP, no missed cycles in the past, takes pill same time every day, no missed pills. Was advised to take UPT at home or in office prior to start of new pack of OCP, started new pack on Sunday, did not take UPT. Patient states she is SA, "not with intercourse, may still be a chance of pregnancy, I guess?". Patient is concerned about missed cycle, recommended OV for further evaluation. Patient scheduled for OV on 01/17/17 at 8:45 am with Dawn [name], CNM. Advised patient would review with Dawn [name], CNM and return call with any additional recommendations, patient is agreeable.   Routing to provider for final review. Patient is agreeable to disposition. Will close encounter.

## 2017-01-17 ENCOUNTER — Encounter: Payer: Self-pay | Admitting: Certified Nurse Midwife

## 2017-01-17 ENCOUNTER — Ambulatory Visit (INDEPENDENT_AMBULATORY_CARE_PROVIDER_SITE_OTHER): Payer: BLUE CROSS/BLUE SHIELD | Admitting: Certified Nurse Midwife

## 2017-01-17 VITALS — BP 100/68 | HR 68 | Resp 16 | Ht 65.0 in | Wt 129.0 lb

## 2017-01-17 DIAGNOSIS — Z3041 Encounter for surveillance of contraceptive pills: Secondary | ICD-10-CM

## 2017-01-17 DIAGNOSIS — R350 Frequency of micturition: Secondary | ICD-10-CM | POA: Diagnosis not present

## 2017-01-17 DIAGNOSIS — N912 Amenorrhea, unspecified: Secondary | ICD-10-CM | POA: Diagnosis not present

## 2017-01-17 LAB — POCT URINALYSIS DIPSTICK
Bilirubin, UA: NEGATIVE
Glucose, UA: NEGATIVE
Ketones, UA: NEGATIVE
NITRITE UA: NEGATIVE
PH UA: 5 (ref 5.0–8.0)
PROTEIN UA: NEGATIVE
RBC UA: NEGATIVE
UROBILINOGEN UA: NEGATIVE U/dL — AB

## 2017-01-17 LAB — POCT URINE PREGNANCY: Preg Test, Ur: NEGATIVE

## 2017-01-17 NOTE — Progress Notes (Signed)
26 y.o. Single Caucasian G0P0here for evaluation of amenorrhea after a month of spotting and prolonged menses for 2 weeks from continuous use for 3 cycles  and then amenorrhea with this cycle. Bloating with cycle has resolved.? GI has on occasion with bread use.  Some urinary frequency while on trip has resolved. Menses duration 5 days with light flow usually.. Patient taking medication as prescribed. Denies missed pills, headaches(had previous to using OCP), nausea, DVT warning signs or symptoms. Sexually active, but no penetration. No other health concerns today.  ROS: pertinent to HPI  O: Healthy female, WD WN Affect: normal orientation X 3  UPT: negative POCT UA: trace of WBC only  Skin: warm and dry CVAT: negative Abdomen: soft, non tender, negative suprapubic External genital: normal female, no lesions Vagina: normal, virginal, no blood noted, normal discharge, non tender Cervix: normal appearance, negative CMT, no discharge or blood Uterus: normal, non tender, no masses Adnexa: normal, non tender, no masses   A: History of continuous use OCP for menstrual headache prevention with Junel 1/20 Fe working well until spotting episode. Now using monthly to assess if needed for migraine headache no aura per patient Amenorrhea due to long cycle, negative UPT here Normal pelvic exam  P: Reviewed findings with patient of normal exam , no indication of infection, UTI or pregnancy. Discussed change with changing from continuous use OCP to monthly, not unusual to have amenorrhea and may have light cycle again. Will advise after next cycle of OCP. Questions addressed. Will keep menses calendar.    RV prn

## 2017-01-17 NOTE — Patient Instructions (Signed)
Oral Contraception Use Oral contraceptive pills (OCPs) are medicines taken to prevent pregnancy. OCPs work by preventing the ovaries from releasing eggs. The hormones in OCPs also cause the cervical mucus to thicken, preventing the sperm from entering the uterus. The hormones also cause the uterine lining to become thin, not allowing a fertilized egg to attach to the inside of the uterus. OCPs are highly effective when taken exactly as prescribed. However, OCPs do not prevent sexually transmitted diseases (STDs). Safe sex practices, such as using condoms along with an OCP, can help prevent STDs. Before taking OCPs, you may have a physical exam and Pap test. Your health care provider may also order blood tests if necessary. Your health care provider will make sure you are a good candidate for oral contraception. Discuss with your health care provider the possible side effects of the OCP you may be prescribed. When starting an OCP, it can take 2 to 3 months for the body to adjust to the changes in hormone levels in your body. How to take oral contraceptive pills Your health care provider may advise you on how to start taking the first cycle of OCPs. Otherwise, you can:  Start on day 1 of your menstrual period. You will not need any backup contraceptive protection with this start time.  Start on the first Sunday after your menstrual period or the day you get your prescription. In these cases, you will need to use backup contraceptive protection for the first week.  Start the pill at any time of your cycle. If you take the pill within 5 days of the start of your period, you are protected against pregnancy right away. In this case, you will not need a backup form of birth control. If you start at any other time of your menstrual cycle, you will need to use another form of birth control for 7 days. If your OCP is the type called a minipill, it will protect you from pregnancy after taking it for 2 days (48  hours).  After you have started taking OCPs:  If you forget to take 1 pill, take it as soon as you remember. Take the next pill at the regular time.  If you miss 2 or more pills, call your health care provider because different pills have different instructions for missed doses. Use backup birth control until your next menstrual period starts.  If you use a 28-day pack that contains inactive pills and you miss 1 of the last 7 pills (pills with no hormones), it will not matter. Throw away the rest of the non-hormone pills and start a new pill pack.  No matter which day you start the OCP, you will always start a new pack on that same day of the week. Have an extra pack of OCPs and a backup contraceptive method available in case you miss some pills or lose your OCP pack. Follow these instructions at home:  Do not smoke.  Always use a condom to protect against STDs. OCPs do not protect against STDs.  Use a calendar to mark your menstrual period days.  Read the information and directions that came with your OCP. Talk to your health care provider if you have questions. Contact a health care provider if:  You develop nausea and vomiting.  You have abnormal vaginal discharge or bleeding.  You develop a rash.  You miss your menstrual period.  You are losing your hair.  You need treatment for mood swings or depression.  You   get dizzy when taking the OCP.  You develop acne from taking the OCP.  You become pregnant. Get help right away if:  You develop chest pain.  You develop shortness of breath.  You have an uncontrolled or severe headache.  You develop numbness or slurred speech.  You develop visual problems.  You develop pain, redness, and swelling in the legs. This information is not intended to replace advice given to you by your health care provider. Make sure you discuss any questions you have with your health care provider. Document Released: 05/25/2011 Document  Revised: 11/11/2015 Document Reviewed: 11/24/2012 Elsevier Interactive Patient Education  2017 Elsevier Inc.  

## 2017-02-25 DIAGNOSIS — M25571 Pain in right ankle and joints of right foot: Secondary | ICD-10-CM | POA: Diagnosis not present

## 2017-03-19 DIAGNOSIS — L738 Other specified follicular disorders: Secondary | ICD-10-CM | POA: Diagnosis not present

## 2017-06-30 ENCOUNTER — Encounter: Payer: Self-pay | Admitting: Certified Nurse Midwife

## 2017-07-01 NOTE — Telephone Encounter (Signed)
Please contact the patient and have her make an appointment to see Ms Darcel BayleyLeonard

## 2017-07-02 ENCOUNTER — Telehealth: Payer: Self-pay | Admitting: Certified Nurse Midwife

## 2017-07-02 NOTE — Telephone Encounter (Signed)
-----   Message from Mychart, Generic sent at 06/30/2017 8:15 AM EST -----    Hello! I've been keeping track of my periods since my visit in August, and they haven't gotten much more regular. In October, I didn't really have much of a period. November lasted 2 weeks (started a week before the placebo pill). December was about a week and a half. January has been not much at all - a day of bleeding, then nothing. I'm also pretty constipated this week. I've been able to poop a small amount each day, but usually just hard pellets with the occasional, slightly more normal, bowel movement. I assume this is hormone-related because I eat a good amount of fiber on a daily basis (I really like beans!), I exercise regularly, and I drink plenty. I can't think of any other lifestyle reasons for my constipation other than hormones! Should I try a different birth control pill to see if a new one is able to make my periods work right?

## 2017-07-02 NOTE — Telephone Encounter (Signed)
Routing to PepsiCoDeborah Leonard CNM for review and advised of OCP.

## 2017-07-03 NOTE — Telephone Encounter (Signed)
I think she needs OV to evaluate and discuss

## 2017-07-03 NOTE — Telephone Encounter (Signed)
Spoke with patient. Appointment scheduled for 07/04/2017 at 8:45 am with Leota Sauerseborah Leonard CNM. Patient is agreeable to date and time.  Routing to provider for final review. Patient agreeable to disposition. Will close encounter.

## 2017-07-04 ENCOUNTER — Other Ambulatory Visit: Payer: Self-pay

## 2017-07-04 ENCOUNTER — Encounter: Payer: Self-pay | Admitting: Certified Nurse Midwife

## 2017-07-04 ENCOUNTER — Ambulatory Visit: Payer: BLUE CROSS/BLUE SHIELD | Admitting: Certified Nurse Midwife

## 2017-07-04 VITALS — BP 110/62 | HR 80 | Ht 65.0 in | Wt 134.0 lb

## 2017-07-04 DIAGNOSIS — Z3041 Encounter for surveillance of contraceptive pills: Secondary | ICD-10-CM | POA: Diagnosis not present

## 2017-07-04 DIAGNOSIS — N926 Irregular menstruation, unspecified: Secondary | ICD-10-CM

## 2017-07-04 NOTE — Patient Instructions (Signed)
Ethinyl Estradiol; Etonogestrel vaginal ring What is this medicine? ETHINYL ESTRADIOL; ETONOGESTREL (ETH in il es tra DYE ole; et oh noe JES trel) vaginal ring is a flexible, vaginal ring used as a contraceptive (birth control method). This medicine combines two types of female hormones, an estrogen and a progestin. This ring is used to prevent ovulation and pregnancy. Each ring is effective for one month. This medicine may be used for other purposes; ask your health care provider or pharmacist if you have questions. COMMON BRAND NAME(S): NuvaRing What should I tell my health care provider before I take this medicine? They need to know if you have or ever had any of these conditions: -abnormal vaginal bleeding -blood vessel disease or blood clots -breast, cervical, endometrial, ovarian, liver, or uterine cancer -diabetes -gallbladder disease -heart disease or recent heart attack -high blood pressure -high cholesterol -kidney disease -liver disease -migraine headaches -stroke -systemic lupus erythematosus (SLE) -tobacco smoker -an unusual or allergic reaction to estrogens, progestins, other medicines, foods, dyes, or preservatives -pregnant or trying to get pregnant -breast-feeding How should I use this medicine? Insert the ring into your vagina as directed. Follow the directions on the prescription label. The ring will remain place for 3 weeks and is then removed for a 1-week break. A new ring is inserted 1 week after the last ring was removed, on the same day of the week. Check often to make sure the ring is still in place, especially before and after sexual intercourse. If the ring was out of the vagina for an unknown amount of time, you may not be protected from pregnancy. Perform a pregnancy test and call your doctor. Do not use more often than directed. A patient package insert for the product will be given with each prescription and refill. Read this sheet carefully each time. The  sheet may change frequently. Contact your pediatrician regarding the use of this medicine in children. Special care may be needed. This medicine has been used in female children who have started having menstrual periods. Overdosage: If you think you have taken too much of this medicine contact a poison control center or emergency room at once. NOTE: This medicine is only for you. Do not share this medicine with others. What if I miss a dose? You will need to replace your vaginal ring once a month as directed. If the ring should slip out, or if you leave it in longer or shorter than you should, contact your health care professional for advice. What may interact with this medicine? Do not take this medicine with the following medication: -dasabuvir; ombitasvir; paritaprevir; ritonavir -ombitasvir; paritaprevir; ritonavir This medicine may also interact with the following medications: -acetaminophen -antibiotics or medicines for infections, especially rifampin, rifabutin, rifapentine, and griseofulvin, and possibly penicillins or tetracyclines -aprepitant -ascorbic acid (vitamin C) -atorvastatin -barbiturate medicines, such as phenobarbital -bosentan -carbamazepine -caffeine -clofibrate -cyclosporine -dantrolene -doxercalciferol -felbamate -grapefruit juice -hydrocortisone -medicines for anxiety or sleeping problems, such as diazepam or temazepam -medicines for diabetes, including pioglitazone -modafinil -mycophenolate -nefazodone -oxcarbazepine -phenytoin -prednisolone -ritonavir or other medicines for HIV infection or AIDS -rosuvastatin -selegiline -soy isoflavones supplements -St. John's wort -tamoxifen or raloxifene -theophylline -thyroid hormones -topiramate -warfarin This list may not describe all possible interactions. Give your health care provider a list of all the medicines, herbs, non-prescription drugs, or dietary supplements you use. Also tell them if you smoke,  drink alcohol, or use illegal drugs. Some items may interact with your medicine. What should I watch for while using   this medicine? Visit your doctor or health care professional for regular checks on your progress. You will need a regular breast and pelvic exam and Pap smear while on this medicine. Use an additional method of contraception during the first cycle that you use this ring. Do not use a diaphragm or female condom, as the ring can interfere with these birth control methods and their proper placement. If you have any reason to think you are pregnant, stop using this medicine right away and contact your doctor or health care professional. If you are using this medicine for hormone related problems, it may take several cycles of use to see improvement in your condition. Smoking increases the risk of getting a blood clot or having a stroke while you are using hormonal birth control, especially if you are more than 29 years old. You are strongly advised not to smoke. This medicine can make your body retain fluid, making your fingers, hands, or ankles swell. Your blood pressure can go up. Contact your doctor or health care professional if you feel you are retaining fluid. This medicine can make you more sensitive to the sun. Keep out of the sun. If you cannot avoid being in the sun, wear protective clothing and use sunscreen. Do not use sun lamps or tanning beds/booths. If you wear contact lenses and notice visual changes, or if the lenses begin to feel uncomfortable, consult your eye care specialist. In some women, tenderness, swelling, or minor bleeding of the gums may occur. Notify your dentist if this happens. Brushing and flossing your teeth regularly may help limit this. See your dentist regularly and inform your dentist of the medicines you are taking. If you are going to have elective surgery, you may need to stop using this medicine before the surgery. Consult your health care professional  for advice. This medicine does not protect you against HIV infection (AIDS) or any other sexually transmitted diseases. What side effects may I notice from receiving this medicine? Side effects that you should report to your doctor or health care professional as soon as possible: -breast tissue changes or discharge -changes in vaginal bleeding during your period or between your periods -chest pain -coughing up blood -dizziness or fainting spells -headaches or migraines -leg, arm or groin pain -severe or sudden headaches -stomach pain (severe) -sudden shortness of breath -sudden loss of coordination, especially on one side of the body -speech problems -symptoms of vaginal infection like itching, irritation or unusual discharge -tenderness in the upper abdomen -vomiting -weakness or numbness in the arms or legs, especially on one side of the body -yellowing of the eyes or skin Side effects that usually do not require medical attention (report to your doctor or health care professional if they continue or are bothersome): -breakthrough bleeding and spotting that continues beyond the 3 initial cycles of pills -breast tenderness -mood changes, anxiety, depression, frustration, anger, or emotional outbursts -increased sensitivity to sun or ultraviolet light -nausea -skin rash, acne, or brown spots on the skin -weight gain (slight) This list may not describe all possible side effects. Call your doctor for medical advice about side effects. You may report side effects to FDA at 1-800-FDA-1088. Where should I keep my medicine? Keep out of the reach of children. Store at room temperature between 15 and 30 degrees C (59 and 86 degrees F) for up to 4 months. The product will expire after 4 months. Protect from light. Throw away any unused medicine after the expiration date. NOTE: This   sheet is a summary. It may not cover all possible information. If you have questions about this medicine, talk  to your doctor, pharmacist, or health care provider.  2018 Elsevier/Gold Standard (2016-02-11 17:00:31)  

## 2017-07-04 NOTE — Progress Notes (Signed)
27 y.o. Single Caucasian G0P0 here for evaluation of Junel 1/20 Fe has been on for almost a year. Continues with heavy cycles at times and not regular pattern. Last week sh should have been a period week and had  one day only,with previous month was two weeks of bleeding. She had previously been on continuous use with this pill and stopped due to irregular bleeding and this had stopped for 2 months and now occurring again. Declines exam today. She just would like to have normal regular cycle monthly. Stressful time now due to marriage planned for spring.   Patient taking medication as prescribed. Denies missed pills, headaches, nausea, DVT warning signs or symptoms,  breakthrough bleeding, or other changes.   Keeping menses calendar.Other options for use?? Denies pelvic pain or other issues today. No other health issues today  O: Healthy female, WD WN Affect: normal orientation X 3    A: History of BTB with continuous use OCP for menstrual migraine prevention, on monthly use with no migraine occurrence but BTB with one cycle since change to monthly withdrawal use. Stress with marriage in spring   P: Discussed options of changing to Nuvaring which is not daily use, which may help with this BTB or increase dosage, patient does not want to that. Discussed IUD use or Seasonique trial, instead of continuous use with Lo estrin. Given information on Nuvaring, IUD and OCP. Wants to do her own research and will advise if desired. Discussed she may still be in transition period from continuous use and is not unusual to have one day period. Also weight change and thyroid can also affect cycle. Patient would like thyroid lab checked today. Questions addressed. Patient will continue her current pack and advise if changes needed. Warning signs of bleeding given. Lab TSH with panel   20  minutes spent with patient with >50% of time spent in face to face counseling.  RV prn, aex

## 2017-07-05 LAB — THYROID PANEL WITH TSH
FREE THYROXINE INDEX: 2.4 (ref 1.2–4.9)
T3 UPTAKE RATIO: 32 % (ref 24–39)
T4, Total: 7.5 ug/dL (ref 4.5–12.0)
TSH: 3.12 u[IU]/mL (ref 0.450–4.500)

## 2017-07-13 DIAGNOSIS — R1310 Dysphagia, unspecified: Secondary | ICD-10-CM | POA: Insufficient documentation

## 2017-07-13 DIAGNOSIS — Z8739 Personal history of other diseases of the musculoskeletal system and connective tissue: Secondary | ICD-10-CM | POA: Diagnosis not present

## 2017-07-13 DIAGNOSIS — K219 Gastro-esophageal reflux disease without esophagitis: Secondary | ICD-10-CM | POA: Diagnosis not present

## 2017-07-13 DIAGNOSIS — M3500 Sicca syndrome, unspecified: Secondary | ICD-10-CM | POA: Insufficient documentation

## 2017-09-06 DIAGNOSIS — L299 Pruritus, unspecified: Secondary | ICD-10-CM | POA: Diagnosis not present

## 2017-09-06 DIAGNOSIS — H6191 Disorder of right external ear, unspecified: Secondary | ICD-10-CM | POA: Diagnosis not present

## 2017-09-12 ENCOUNTER — Encounter: Payer: Self-pay | Admitting: Certified Nurse Midwife

## 2017-09-12 ENCOUNTER — Ambulatory Visit: Payer: BLUE CROSS/BLUE SHIELD | Admitting: Nurse Practitioner

## 2017-09-12 ENCOUNTER — Other Ambulatory Visit: Payer: Self-pay

## 2017-09-12 ENCOUNTER — Ambulatory Visit: Payer: BLUE CROSS/BLUE SHIELD | Admitting: Certified Nurse Midwife

## 2017-09-12 VITALS — BP 118/76 | HR 74 | Resp 16 | Ht 65.25 in | Wt 130.0 lb

## 2017-09-12 DIAGNOSIS — Z01419 Encounter for gynecological examination (general) (routine) without abnormal findings: Secondary | ICD-10-CM | POA: Diagnosis not present

## 2017-09-12 DIAGNOSIS — Z3041 Encounter for surveillance of contraceptive pills: Secondary | ICD-10-CM

## 2017-09-12 MED ORDER — JUNEL FE 1/20 1-20 MG-MCG PO TABS: 1.0000 | ORAL_TABLET | Freq: Every day | ORAL | 4 refills | Status: DC

## 2017-09-12 NOTE — Progress Notes (Signed)
27 y.o. G0P0 Single  Caucasian [name] here for annual exam. Periods regular, no BTB in the past month, just early menses by two days. No warning signs with OCP's noted. Has one migraine, no aura during off week only, mild, no issues. Working out at gym and is working well for [employer]. Not sexually active ever. GI issues have improved with daily probiotic use now. Feels much better. Continues follow up with [facility] for Sjogren's, no change. Sees Urgent care if needed. No other health concerns today.   Patient's last menstrual period was 08/19/2017 (exact date).          Sexually active: No.  The current method of family planning is OCP (estrogen/progesterone).    Exercising: Yes.    gym, weights & cardio Smoker:  no  Health Maintenance: Pap:  09-03-15 neg History of Abnormal Pap: no MMG:  2018 breast u/s aspiration Self Breast exams: yes Colonoscopy:  none BMD:   none TDaP:  2018 Shingles: no Pneumonia: no Hep C and HIV: HIV neg 2018 Labs: if needed   reports that she has never smoked. She has never used smokeless tobacco. She reports that she drinks about 0.6 - 1.2 oz of alcohol per week. She reports that she does not use drugs.  Past Medical History:  Diagnosis Date  . Breast mass 07/25/2016   Pain with lump  . Fracture of left foot 2007   treated with brace  . Hx of degenerative disc disease   . Migraine    light sensitivity, better with ocp  . Oligomenorrhea    better on OCP    Past Surgical History:  Procedure Laterality Date  . fishbone removal     pt swallowed fishbone & had to be surgically removed  . IRRIGATION AND DEBRIDEMENT SEBACEOUS CYST  08/2012   right shoulder area  . WISDOM TOOTH EXTRACTION      Current Outpatient Medications  Medication Sig Dispense Refill  . Ibuprofen (ADVIL PO) Take by mouth as needed.    Dawn [name] 1/20 1-20 MG-MCG tablet   3  . L-THEANINE PO Take by mouth as needed.    . Loratadine 10 MG CAPS Take by mouth.    . Omega-3 Fatty  Acids (FISH OIL PO) Take by mouth.    . Probiotic Product (PROBIOTIC PO) Take by mouth.    . ranitidine (ZANTAC) 75 MG tablet Take 75 mg by mouth as needed for heartburn.     No current facility-administered medications for this visit.     Family History  Problem Relation Age of Onset  . Hyperlipidemia Mother   . Hypertension Mother   . Breast cancer Maternal Grandmother 40  . Parkinson's disease Paternal Grandmother   . Colon cancer Paternal Grandfather 48       age 71 in 2018    ROS:  Pertinent items are noted in HPI.  Otherwise, a comprehensive ROS was negative.  Exam:   BP 118/76   Pulse 74   Resp 16   Ht 5' 5.25" (1.657 m)   Wt 130 lb (59 kg)   LMP 08/19/2017 (Exact Date)   BMI 21.47 kg/m  Height: 5' 5.25" (165.7 cm) Ht Readings from Last 3 Encounters:  09/12/17 5' 5.25" (1.657 m)  07/04/17 5\' 5"  (1.651 m)  01/17/17 5\' 5"  (1.651 m)    General appearance: alert, cooperative and appears stated age Head: Normocephalic, without obvious abnormality, atraumatic Neck: no adenopathy, supple, symmetrical, trachea midline and thyroid normal to inspection  and palpation Lungs: clear to auscultation bilaterally Breasts: normal appearance, no masses or tenderness, No nipple retraction or dimpling, No nipple discharge or bleeding, No axillary or supraclavicular adenopathy Heart: regular rate and rhythm Abdomen: soft, non-tender; no masses,  no organomegaly Extremities: extremities normal, atraumatic, no cyanosis or edema Skin: Skin color, texture, turgor normal. No rashes or lesions Lymph nodes: Cervical, supraclavicular, and axillary nodes normal. No abnormal inguinal nodes palpated Neurologic: Grossly normal   Pelvic: External genitalia:  no lesions, normal female              Urethra:  normal appearing urethra with no masses, tenderness or lesions              Bartholin's and Skene's: normal                 Vagina: normal appearing vagina with normal color and discharge,  no lesions              Cervix: no cervical motion tenderness, no lesions and nulliparous appearance              Pap taken: No. Bimanual Exam:  Uterus:  normal size, contour, position, consistency, mobility, non-tender and anteverted              Adnexa: normal adnexa and no mass, fullness, tenderness               Rectovaginal: Confirms               Anus:  normal appearance  Chaperone present: yes  A:  Well Woman with normal exam  Contraception OCP working well with occasional BTB  History of menstrual migraine no aura, better with OCP use  Started regular exercise program  History of GI changes, improved  History of Sjogren's disease no change  P:   Reviewed health and wellness pertinent to exam  Risks/benefits/warning signs of OCP reviewed, desires continuance. Discussed need to advise if BTB increases.  Rx Junel [name] 1/20 see order with instructions  Warning signs of migraine discussed and need to advise if aura occurs.  Continue exercise as planned.  Discussed continued use of probiotic for GI comfort  Continue follow up as indicated with MD  Pap smear: no   counseled on breast self exam, STD prevention, HIV risk factors and prevention, use and side effects of OCP's, adequate intake of calcium and vitamin D, diet and exercise  return annually or prn  An After Visit Summary was printed and given to the patient.

## 2017-09-12 NOTE — Patient Instructions (Signed)
General topics  Next pap or exam is  due in 1 year Take a Women's multivitamin Take 1200 mg. of calcium daily - prefer dietary If any concerns in interim to call back  Breast Self-Awareness Practicing breast self-awareness may pick up problems early, prevent significant medical complications, and possibly save your life. By practicing breast self-awareness, you can become familiar with how your breasts look and feel and if your breasts are changing. This allows you to notice changes early. It can also offer you some reassurance that your breast health is good. One way to learn what is normal for your breasts and whether your breasts are changing is to do a breast self-exam. If you find a lump or something that was not present in the past, it is best to contact your caregiver right away. Other findings that should be evaluated by your caregiver include nipple discharge, especially if it is bloody; skin changes or reddening; areas where the skin seems to be pulled in (retracted); or new lumps and bumps. Breast pain is seldom associated with cancer (malignancy), but should also be evaluated by a caregiver. BREAST SELF-EXAM The best time to examine your breasts is 5 7 days after your menstrual period is over.  ExitCare Patient Information 2013 [employer].   Exercise to Stay Healthy Exercise helps you become and stay healthy. EXERCISE IDEAS AND TIPS Choose exercises that:  You enjoy.  Fit into your day. You do not need to exercise really hard to be healthy. You can do exercises at a slow or medium level and stay healthy. You can:  Stretch before and after working out.  Try yoga, Pilates, or tai chi.  Lift weights.  Walk fast, swim, jog, run, climb stairs, bicycle, dance, or rollerskate.  Take aerobic classes. Exercises that burn about 150 calories:  Running 1  miles in 15 minutes.  Playing volleyball for 45 to 60 minutes.  Washing and waxing a car for 45 to 60  minutes.  Playing touch football for 45 minutes.  Walking 1  miles in 35 minutes.  Pushing a stroller 1  miles in 30 minutes.  Playing basketball for 30 minutes.  Raking leaves for 30 minutes.  Bicycling 5 miles in 30 minutes.  Walking 2 miles in 30 minutes.  Dancing for 30 minutes.  Shoveling snow for 15 minutes.  Swimming laps for 20 minutes.  Walking up stairs for 15 minutes.  Bicycling 4 miles in 15 minutes.  Gardening for 30 to 45 minutes.  Jumping rope for 15 minutes.  Washing windows or floors for 45 to 60 minutes. Document Released: 07/08/2010 Document Revised: 08/28/2011 Document Reviewed: 07/08/2010 ExitCare Patient Information 2013 [employer].   Other topics ( that may be useful information):    Sexually Transmitted Disease Sexually transmitted disease (STD) refers to any infection that is passed from person to person during sexual activity. This may happen by way of saliva, semen, blood, vaginal mucus, or urine. Common STDs include:  Gonorrhea.  Chlamydia.  Syphilis.  HIV/AIDS.  Genital herpes.  Hepatitis B and C.  Trichomonas.  Human papillomavirus (HPV).  Pubic lice. CAUSES  An STD may be spread by bacteria, virus, or parasite. A person can get an STD by:  Sexual intercourse with an infected person.  Sharing sex toys with an infected person.  Sharing needles with an infected person.  Having intimate contact with the genitals, mouth, or rectal areas of an infected person. SYMPTOMS  Some people may not have any symptoms, but   they can still pass the infection to others. Different STDs have different symptoms. Symptoms include:  Painful or bloody urination.  Pain in the pelvis, abdomen, vagina, anus, throat, or eyes.  Skin rash, itching, irritation, growths, or sores (lesions). These usually occur in the genital or anal area.  Abnormal vaginal discharge.  Penile discharge in men.  Soft, flesh-colored skin growths in the  genital or anal area.  Fever.  Pain or bleeding during sexual intercourse.  Swollen glands in the groin area.  Yellow skin and eyes (jaundice). This is seen with hepatitis. DIAGNOSIS  To make a diagnosis, your caregiver may:  Take a medical history.  Perform a physical exam.  Take a specimen (culture) to be examined.  Examine a sample of discharge under a microscope.  Perform blood test TREATMENT   Chlamydia, gonorrhea, trichomonas, and syphilis can be cured with antibiotic medicine.  Genital herpes, hepatitis, and HIV can be treated, but not cured, with prescribed medicines. The medicines will lessen the symptoms.  Genital warts from HPV can be treated with medicine or by freezing, burning (electrocautery), or surgery. Warts may come back.  HPV is a virus and cannot be cured with medicine or surgery.However, abnormal areas may be followed very closely by your caregiver and may be removed from the cervix, vagina, or vulva through office procedures or surgery. If your diagnosis is confirmed, your recent sexual partners need treatment. This is true even if they are symptom-free or have a negative culture or evaluation. They should not have sex until their caregiver says it is okay. HOME CARE INSTRUCTIONS  All sexual partners should be informed, tested, and treated for all STDs.  Take your antibiotics as directed. Finish them even if you start to feel better.  Only take over-the-counter or prescription medicines for pain, discomfort, or fever as directed by your caregiver.  Rest.  Eat a balanced diet and drink enough fluids to keep your urine clear or pale yellow.  Do not have sex until treatment is completed and you have followed up with your caregiver. STDs should be checked after treatment.  Keep all follow-up appointments, Pap tests, and blood tests as directed by your caregiver.  Only use latex condoms and water-soluble lubricants during sexual activity. Do not use  petroleum jelly or oils.  Avoid alcohol and illegal drugs.  Get vaccinated for HPV and hepatitis. If you have not received these vaccines in the past, talk to your caregiver about whether one or both might be right for you.  Avoid risky sex practices that can break the skin. The only way to avoid getting an STD is to avoid all sexual activity.Latex condoms and dental dams (for oral sex) will help lessen the risk of getting an STD, but will not completely eliminate the risk. SEEK MEDICAL CARE IF:   You have a fever.  You have any new or worsening symptoms. Document Released: 08/26/2002 Document Revised: 08/28/2011 Document Reviewed: 09/02/2010 [facility] Patient Information 2013 [employer].    Domestic Abuse You are being battered or abused if someone close to you hits, pushes, or physically hurts you in any way. You also are being abused if you are forced into activities. You are being sexually abused if you are forced to have sexual contact of any kind. You are being emotionally abused if you are made to feel worthless or if you are constantly threatened. It is important to remember that help is available. No one has the right to abuse you. PREVENTION OF FURTHER  ABUSE  Learn the warning signs of danger. This varies with situations but may include: the use of alcohol, threats, isolation from friends and family, or forced sexual contact. Leave if you feel that violence is going to occur.  If you are attacked or beaten, report it to the police so the abuse is documented. You do not have to press charges. The police can protect you while you or the attackers are leaving. Get the officer's name and badge number and a copy of the report.  Find someone you can trust and tell them what is happening to you: your caregiver, a nurse, clergy member, close friend or family member. Feeling ashamed is natural, but remember that you have done nothing wrong. No one deserves abuse. Document Released:  06/02/2000 Document Revised: 08/28/2011 Document Reviewed: 08/11/2010 ExitCare Patient Information 2013 [employer].    How Much is Too Much Alcohol? Drinking too much alcohol can cause injury, accidents, and health problems. These types of problems can include:   Car crashes.  Falls.  Family fighting (domestic violence).  Drowning.  Fights.  Injuries.  Burns.  Damage to certain organs.  Having a baby with birth defects. ONE DRINK CAN BE TOO MUCH WHEN YOU ARE:  Working.  Pregnant or breastfeeding.  Taking medicines. Ask your doctor.  Driving or planning to drive. If you or someone you know has a drinking problem, get help from a doctor.  Document Released: 04/01/2009 Document Revised: 08/28/2011 Document Reviewed: 04/01/2009 ExitCare Patient Information 2013 [employer].   Smoking Hazards Smoking cigarettes is extremely bad for your health. Tobacco smoke has over 200 known poisons in it. There are over 60 chemicals in tobacco smoke that cause cancer. Some of the chemicals found in cigarette smoke include:   Cyanide.  Benzene.  Formaldehyde.  Methanol (wood alcohol).  Acetylene (fuel used in welding torches).  Ammonia. Cigarette smoke also contains the poisonous gases nitrogen oxide and carbon monoxide.  Cigarette smokers have an increased risk of many serious medical problems and Smoking causes approximately:  90% of all lung cancer deaths in men.  80% of all lung cancer deaths in women.  90% of deaths from chronic obstructive lung disease. Compared with nonsmokers, smoking increases the risk of:  Coronary heart disease by 2 to 4 times.  Stroke by 2 to 4 times.  Men developing lung cancer by 23 times.  Women developing lung cancer by 13 times.  Dying from chronic obstructive lung diseases by 12 times.  . Smoking is the most preventable cause of death and disease in our society.  WHY IS SMOKING ADDICTIVE?  Nicotine is the chemical  agent in tobacco that is capable of causing addiction or dependence.  When you smoke and inhale, nicotine is absorbed rapidly into the bloodstream through your lungs. Nicotine absorbed through the lungs is capable of creating a powerful addiction. Both inhaled and non-inhaled nicotine may be addictive.  Addiction studies of cigarettes and spit tobacco show that addiction to nicotine occurs mainly during the teen years, when young people begin using tobacco products. WHAT ARE THE BENEFITS OF QUITTING?  There are many health benefits to quitting smoking.   Likelihood of developing cancer and heart disease decreases. Health improvements are seen almost immediately.  Blood pressure, pulse rate, and breathing patterns start returning to normal soon after quitting. QUITTING SMOKING   American Lung Association - 1-800-LUNGUSA  American Cancer Society - 1-800-ACS-2345 Document Released: 07/13/2004 Document Revised: 08/28/2011 Document Reviewed: 03/17/2009 ExitCare Patient Information 2013 ExitCare[employer]   LL[employer].   Stress Management Stress is a state of physical or mental tension that often results from changes in your life or normal routine. Some common causes of stress are:  Death of a loved one.  Injuries or severe illnesses.  Getting fired or changing jobs.  Moving into a new home. Other causes may be:  Sexual problems.  Business or financial losses.  Taking on a large debt.  Regular conflict with someone at home or at work.  Constant tiredness from lack of sleep. It is not just bad things that are stressful. It may be stressful to:  Win the lottery.  Get married.  Buy a new car. The amount of stress that can be easily tolerated varies from person to person. Changes generally cause stress, regardless of the types of change. Too much stress can affect your health. It may lead to physical or emotional problems. Too little stress (boredom) may also become stressful. SUGGESTIONS TO  REDUCE STRESS:  Talk things over with your family and friends. It often is helpful to share your concerns and worries. If you feel your problem is serious, you may want to get help from a professional counselor.  Consider your problems one at a time instead of lumping them all together. Trying to take care of everything at once may seem impossible. List all the things you need to do and then start with the most important one. Set a goal to accomplish 2 or 3 things each day. If you expect to do too many in a single day you will naturally fail, causing you to feel even more stressed.  Do not use alcohol or drugs to relieve stress. Although you may feel better for a short time, they do not remove the problems that caused the stress. They can also be habit forming.  Exercise regularly - at least 3 times per week. Physical exercise can help to relieve that "uptight" feeling and will relax you.  The shortest distance between despair and hope is often a good night's sleep.  Go to bed and get up on time allowing yourself time for appointments without being rushed.  Take a short "time-out" period from any stressful situation that occurs during the day. Close your eyes and take some deep breaths. Starting with the muscles in your face, tense them, hold it for a few seconds, then relax. Repeat this with the muscles in your neck, shoulders, hand, stomach, back and legs.  Take good care of yourself. Eat a balanced diet and get plenty of rest.  Schedule time for having fun. Take a break from your daily routine to relax. HOME CARE INSTRUCTIONS   Call if you feel overwhelmed by your problems and feel you can no longer manage them on your own.  Return immediately if you feel like hurting yourself or someone else. Document Released: 11/29/2000 Document Revised: 08/28/2011 Document Reviewed: 07/22/2007 ExitCare Patient Information 2013 [employer]Care[employer].   

## 2017-10-23 DIAGNOSIS — L239 Allergic contact dermatitis, unspecified cause: Secondary | ICD-10-CM | POA: Diagnosis not present

## 2018-02-07 ENCOUNTER — Other Ambulatory Visit: Payer: Self-pay

## 2018-02-07 ENCOUNTER — Telehealth: Payer: Self-pay | Admitting: Certified Nurse Midwife

## 2018-02-07 ENCOUNTER — Ambulatory Visit: Payer: BLUE CROSS/BLUE SHIELD | Admitting: Certified Nurse Midwife

## 2018-02-07 ENCOUNTER — Encounter: Payer: Self-pay | Admitting: Certified Nurse Midwife

## 2018-02-07 VITALS — BP 108/68 | HR 64 | Temp 98.3°F | Resp 16 | Ht 65.25 in | Wt 119.0 lb

## 2018-02-07 DIAGNOSIS — R599 Enlarged lymph nodes, unspecified: Secondary | ICD-10-CM

## 2018-02-07 DIAGNOSIS — N898 Other specified noninflammatory disorders of vagina: Secondary | ICD-10-CM | POA: Diagnosis not present

## 2018-02-07 DIAGNOSIS — N893 Dysplasia of vagina, unspecified: Secondary | ICD-10-CM | POA: Diagnosis not present

## 2018-02-07 LAB — CBC WITH DIFFERENTIAL/PLATELET
BASOS ABS: 0 10*3/uL (ref 0.0–0.2)
Basos: 1 %
EOS (ABSOLUTE): 0.1 10*3/uL (ref 0.0–0.4)
EOS: 2 %
HEMATOCRIT: 41.2 % (ref 34.0–46.6)
Hemoglobin: 13.9 g/dL (ref 11.1–15.9)
IMMATURE GRANULOCYTES: 0 %
Immature Grans (Abs): 0 10*3/uL (ref 0.0–0.1)
Lymphocytes Absolute: 1.9 10*3/uL (ref 0.7–3.1)
Lymphs: 29 %
MCH: 29.4 pg (ref 26.6–33.0)
MCHC: 33.7 g/dL (ref 31.5–35.7)
MCV: 87 fL (ref 79–97)
MONOS ABS: 0.4 10*3/uL (ref 0.1–0.9)
Monocytes: 7 %
NEUTROS PCT: 61 %
Neutrophils Absolute: 4 10*3/uL (ref 1.4–7.0)
Platelets: 336 10*3/uL (ref 150–450)
RBC: 4.72 x10E6/uL (ref 3.77–5.28)
RDW: 13.3 % (ref 12.3–15.4)
WBC: 6.5 10*3/uL (ref 3.4–10.8)

## 2018-02-07 NOTE — Telephone Encounter (Signed)
Call to patient. Patient states she noticed the swollen inguinal lymph node 2 days ago. Denies recent fever, sickness or international travel. Patient states she has been under a lot of stress lately, but unsure if that's related. RN advised OV needed for further evaluation. Patient requesting appointment today if possible for peace of mind as this has been worrisome to her. Patient scheduled for 02-07-18 at 1600. Patient agreeable to date and time of appointment.   Routing to provider for final review. Patient agreeable to disposition. Will close encounter.

## 2018-02-07 NOTE — Patient Instructions (Signed)
Lymphadenopathy Lymphadenopathy refers to swollen or enlarged lymph glands, also called lymph nodes. Lymph glands are part of your body's defense (immune) system, which protects the body from infections, germs, and diseases. Lymph glands are found in many locations in your body, including the neck, underarm, and groin. Many things can cause lymph glands to become enlarged. When your immune system responds to germs, such as viruses or bacteria, infection-fighting cells and fluid build up. This causes the glands to grow in size. Usually, this is not something to worry about. The swelling and any soreness often go away without treatment. However, swollen lymph glands can also be caused by a number of diseases. Your health care provider may do various tests to help determine the cause. If the cause of your swollen lymph glands cannot be found, it is important to monitor your condition to make sure the swelling goes away. Follow these instructions at home: Watch your condition for any changes. The following actions may help to lessen any discomfort you are feeling:  Get plenty of rest.  Take medicines only as directed by your health care provider. Your health care provider may recommend over-the-counter medicines for pain.  Apply moist heat compresses to the site of swollen lymph nodes as directed by your health care provider. This can help reduce any pain.  Check your lymph nodes daily for any changes.  Keep all follow-up visits as directed by your health care provider. This is important.  Contact a health care provider if:  Your lymph nodes are still swollen after 2 weeks.  Your swelling increases or spreads to other areas.  Your lymph nodes are hard, seem fixed to the skin, or are growing rapidly.  Your skin over the lymph nodes is red and inflamed.  You have a fever.  You have chills.  You have fatigue.  You develop a sore throat.  You have abdominal pain.  You have weight  loss.  You have night sweats. Get help right away if:  You notice fluid leaking from the area of the enlarged lymph node.  You have severe pain in any area of your body.  You have chest pain.  You have shortness of breath. This information is not intended to replace advice given to you by your health care provider. Make sure you discuss any questions you have with your health care provider. Document Released: 03/14/2008 Document Revised: 11/11/2015 Document Reviewed: 01/08/2014 Elsevier Interactive Patient Education  2018 Elsevier Inc.  

## 2018-02-07 NOTE — Progress Notes (Signed)
Subjective:     Patient ID: Dawn [name], female   DOB: 09/16/1990, 27 y.o.   MRN: [id number]  27 yo white female here with complaint of lump in groin noted 2 days ago. Denies tenderness, or redness. 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 Has not been sexually active, no STD concerns. Declines swimming in lake or swimming hole or pond. Periods normal, with OCP use.  Has not had any viral illness or traveled out of the country. No other health issues today.    Review of Systems  Constitutional: Negative.  Negative for appetite change.  HENT: Negative.   Respiratory: Negative.   Gastrointestinal: Positive for diarrhea. Negative for abdominal distention, abdominal pain, nausea and vomiting.       One occurrence only a few weeks ago  Genitourinary: Negative for pelvic pain, vaginal discharge and vaginal pain.  Skin: Negative.   Neurological: Negative.   Hematological: Negative.   Psychiatric/Behavioral: The patient is nervous/anxious.        At times, has noted some weight loss, but was working out "a lot". Eating better now       Objective:   Physical Exam  Constitutional: She is oriented to person, place, and time. She appears well-developed and well-nourished.  Abdominal: Soft. Normal appearance. There is no hepatosplenomegaly or splenomegaly. There is no tenderness.  Genitourinary: Rectum normal and uterus normal. Pelvic exam was performed with patient supine. There is no rash, tenderness, lesion or injury on the right labia. There is no rash, tenderness, lesion or injury on the left labia. Uterus is not tender. Cervix exhibits no motion tenderness, no discharge and no friability. Right adnexum displays no mass, no tenderness and no fullness. Left adnexum displays no mass, no tenderness and no fullness. No erythema, tenderness or bleeding in the vagina. No foreign body in the vagina. Vaginal discharge found.  Genitourinary Comments: Mild inguinal lymph node enlargement bilateral, slightly  tender on right only. Scant slightly odorous discharge.  Lymphadenopathy:       Head (right side): No submental, no submandibular, no tonsillar, no preauricular, no posterior auricular and no occipital adenopathy present.       Head (left side): No submental, no submandibular, no tonsillar, no preauricular, no posterior auricular and no occipital adenopathy present.    She has no cervical adenopathy.    She has no axillary adenopathy.       Right axillary: No pectoral and no lateral adenopathy present.       Left axillary: No pectoral and no lateral adenopathy present.Inguinal adenopathy noted on the right and left side.       Right: No supraclavicular adenopathy present.       Left: No supraclavicular adenopathy present.  Neurological: She is alert and oriented to person, place, and time.  Skin: Skin is warm and dry.  Psychiatric: She has a normal mood and affect. Her behavior is normal. Judgment and thought content normal.       Assessment:     Normal pelvic exam Inguinal lymph nodes mildly enlarged probably related to waxing R/O vaginal infection    Plan:     Discussed finding with patient of normal pelvic exam and mildly enlarged lymph nodes and possible etiology of ingrown hair not seen or vaginal infection/ viral infection response.. Patient has no history of HSV and never sexually active and normal pelvic exam. Discussed epsom salt soak to tender area. Needs to notify if other areas or lumps develop. Warning signs given. Work on good diet and  rest.. Lab: CBC with Diff, affirm  Rv prn

## 2018-02-07 NOTE — Telephone Encounter (Signed)
Patient sent the following correspondence through MyChart. Routing to triage to assist patient with advice request.  Message   Hello! I wasn't sure which of my doctors was the appropriate one to ask about this, but due to the location, I figured I'd start here. Two days ago I noticed what I assume is a slightly swollen inguinal lymph node. It's not painful or anything and it moves around when I poke at it, but I found it a little concerning to suddenly have a bump somewhere I haven't ever noticed a bump before, especially since it's just the one. I felt around other lymph node locations (underarms, neck, etc) and didn't feel anything out of the ordinary, at least that I could tell. Should I give it a week or two to see if it goes away? I read online about warm compresses, but haven't tried it - wanted to ask for advice before trying things I read on the internet! Thanks!

## 2018-02-09 LAB — VAGINITIS/VAGINOSIS, DNA PROBE
Candida Species: NEGATIVE
GARDNERELLA VAGINALIS: NEGATIVE
TRICHOMONAS VAG: NEGATIVE

## 2018-02-27 DIAGNOSIS — M67911 Unspecified disorder of synovium and tendon, right shoulder: Secondary | ICD-10-CM | POA: Diagnosis not present

## 2018-04-04 ENCOUNTER — Telehealth: Payer: Self-pay | Admitting: Certified Nurse Midwife

## 2018-04-04 NOTE — Telephone Encounter (Signed)
It is fine to go ahead and start new pack. Pill amenorrhea can occur at any time, but does need a visit.

## 2018-04-04 NOTE — Telephone Encounter (Signed)
Patient returned call. Message given to patient as seen below from Leota Sauers, CNM and patient verbalized understanding. Will keep appointment as scheduled for Wednesday 04-10-18 at 0800.   Routing to provider and will close encounter.

## 2018-04-04 NOTE — Telephone Encounter (Signed)
Patient sent the following message through MyChart. Routing to triage to assist patient with request.   Hello! I've been tracking my cycles on the pill and I think the pill isn't working anymore. I was supposed to have my period this week, and I don't. This happened last month, too. So, all in all, it's been 2 months since I've had a period. I've skipped a month here and there, but never 2 in a row. There's no chance I'm pregnant, so I assume the Junel is no longer working for my body. Should I give it another month and see if this changes next month? Or should I start looking into other options? Thanks!

## 2018-04-04 NOTE — Telephone Encounter (Signed)
Message left to return call to Triage Nurse at [phone].    

## 2018-04-04 NOTE — Telephone Encounter (Signed)
Call to patient. Patient states LMP was "sometime in August." Unsure of date. Patient states she has now missed 2 consecutive cycles. States not uncommon for her to miss a period every now and then, but has never missed 2 in a row. States no chance of pregnancy as she is not sexually active. Patient states she has been on Junel for about 2 years now and does not take continuously. OV offered to patient to discuss. Patient scheduled for Wednesday 04-10-18 at 0800. Patient agreeable to date and time of appointment. Patient states she is due to start a new pack on Sunday and wondering if she still needs to do that? RN advised would review with Debbi and return call. Patient agreeable.   Routing to provider for review.

## 2018-04-09 DIAGNOSIS — M7662 Achilles tendinitis, left leg: Secondary | ICD-10-CM | POA: Diagnosis not present

## 2018-04-10 ENCOUNTER — Ambulatory Visit: Payer: BLUE CROSS/BLUE SHIELD | Admitting: Certified Nurse Midwife

## 2018-04-10 ENCOUNTER — Encounter: Payer: Self-pay | Admitting: Certified Nurse Midwife

## 2018-04-10 ENCOUNTER — Other Ambulatory Visit: Payer: Self-pay

## 2018-04-10 VITALS — BP 110/70 | HR 70 | Resp 16 | Wt 125.0 lb

## 2018-04-10 DIAGNOSIS — Z3202 Encounter for pregnancy test, result negative: Secondary | ICD-10-CM | POA: Diagnosis not present

## 2018-04-10 DIAGNOSIS — Z3041 Encounter for surveillance of contraceptive pills: Secondary | ICD-10-CM

## 2018-04-10 DIAGNOSIS — N912 Amenorrhea, unspecified: Secondary | ICD-10-CM

## 2018-04-10 LAB — POCT URINE PREGNANCY: Preg Test, Ur: NEGATIVE

## 2018-04-10 NOTE — Patient Instructions (Signed)
Secondary Amenorrhea Secondary amenorrhea is the stopping of menstrual flow for 3-6 months in a female who has previously had periods. There are many possible causes. Most of these causes are not serious. Usually, treating the underlying problem causing the loss of menses will return your periods to normal. What are the causes? Some common and uncommon causes of not menstruating include:  Malnutrition.  Low blood sugar (hypoglycemia).  Polycystic ovary disease.  Stress or fear.  Breastfeeding.  Hormone imbalance.  Ovarian failure.  Medicines.  Extreme obesity.  Cystic fibrosis.  Low body weight or drastic weight reduction from any cause.  Early menopause.  Removal of ovaries or uterus.  Contraceptives.  Illness.  Long-term (chronic) illnesses.  Cushing syndrome.  Thyroid problems.  Birth control pills, patches, or vaginal rings for birth control.  What increases the risk? You may be at greater risk of secondary amenorrhea if:  You have a family history of this condition.  You have an eating disorder.  You do athletic training.  How is this diagnosed? A diagnosis is made by your health care provider taking a medical history and doing a physical exam. This will include a pelvic exam to check for problems with your reproductive organs. Pregnancy must be ruled out. Often, numerous blood tests are done to measure different hormones in the body. Urine testing may be done. Specialized exams (ultrasound, CT scan, MRI, or hysteroscopy) may have to be done as well as measuring the body mass index (BMI). How is this treated? Treatment depends on the cause of the amenorrhea. If an eating disorder is present, this can be treated with an adequate diet and therapy. Chronic illnesses may improve with treatment of the illness. Amenorrhea may be corrected with medicines, lifestyle changes, or surgery. If the amenorrhea cannot be corrected, it is sometimes possible to create a  false menstruation with medicines. Follow these instructions at home:  Maintain a healthy diet.  Manage weight problems.  Exercise regularly but not excessively.  Get adequate sleep.  Manage stress.  Be aware of changes in your menstrual cycle. Keep a record of when your periods occur. Note the date your period starts, how long it lasts, and any problems. Contact a health care provider if: Your symptoms do not get better with treatment. This information is not intended to replace advice given to you by your health care provider. Make sure you discuss any questions you have with your health care provider. Document Released: 07/17/2006 Document Revised: 11/11/2015 Document Reviewed: 11/21/2012 Elsevier Interactive Patient Education  2018 Elsevier Inc.  

## 2018-04-10 NOTE — Progress Notes (Signed)
  27 y.o.Single Caucasian female presents with no menses since 8/19.  Consistent use of Junel 1/20 and has regular periods with use, until the last two months. No missed pills. UPT here negative. Not sexually active ever. Patient has been under stress and has weight loss of about 5 pounds. Concerned that this is not normal to miss cycles. Periods were monthly with Junel 1/20. Denies nausea,diarrhea, abdominal or pelvic pain. Sprained left ankle yesterday and now in boot for support.No other health issues today.       Review of Systems  Constitutional: Positive for weight loss. Negative for malaise/fatigue.  HENT: Negative.   Respiratory: Negative.   Cardiovascular: Negative.   Gastrointestinal: Negative.   Genitourinary: Negative.   Musculoskeletal: Negative.   Skin: Negative.   Neurological: Negative.   Endo/Heme/Allergies: Negative.   Psychiatric/Behavioral: The patient is nervous/anxious.    Physical Exam  Constitutional: She is oriented to person, place, and time. She appears well-developed and well-nourished.  Neck: No thyromegaly present.  Abdominal: Soft. She exhibits no mass. There is no tenderness. There is no guarding.  Genitourinary: Rectum normal, vagina normal and uterus normal. Pelvic exam was performed with patient supine. There is no rash, tenderness, lesion or injury on the right labia. There is no rash, tenderness, lesion or injury on the left labia. Cervix exhibits no motion tenderness, no discharge and no friability. Right adnexum displays no mass, no tenderness and no fullness. Left adnexum displays no mass and no tenderness. No tenderness or bleeding in the vagina. No vaginal discharge found.  Genitourinary Comments: Normal physiological discharge noted  Lymphadenopathy:    She has no cervical adenopathy. No inguinal adenopathy noted on the right or left side.  Neurological: She is alert and oriented to person, place, and time.  Skin: Skin is warm and dry.   Psychiatric: She has a normal mood and affect. Her behavior is normal. Judgment and thought content normal.     Assessment:  Normal pelvic exam Amenorrhea with negative UPT, not sexually active  OCP use for cycle control   Plan: Discussed with patient factors that may be contributory to menstrual abnormalities include diet change with weight loss and recent stressors in her life and now foot injury. Also thyroid or pituitary change. Discussed will check labs and if normal, just related to OCP use, which is not abnormal or harmful to her. Questions addressed. Patient will advise when menses occurs and continue OCP use daily as before. Labs:prolactin, TSH  Rv prn     .

## 2018-04-12 LAB — PROLACTIN: PROLACTIN: 6.7 ng/mL (ref 4.8–23.3)

## 2018-04-12 LAB — TSH: TSH: 2.6 u[IU]/mL (ref 0.450–4.500)

## 2018-08-06 ENCOUNTER — Other Ambulatory Visit: Payer: Self-pay | Admitting: Orthopedic Surgery

## 2018-08-06 DIAGNOSIS — M545 Low back pain, unspecified: Secondary | ICD-10-CM

## 2018-08-06 DIAGNOSIS — G8929 Other chronic pain: Secondary | ICD-10-CM

## 2018-08-06 DIAGNOSIS — M546 Pain in thoracic spine: Secondary | ICD-10-CM

## 2018-08-14 ENCOUNTER — Telehealth: Payer: Self-pay | Admitting: Certified Nurse Midwife

## 2018-08-14 NOTE — Telephone Encounter (Signed)
MyChart message to patient.  

## 2018-08-14 NOTE — Telephone Encounter (Addendum)
Left message to call Noreene Larsson, RN at [facility] [phone].    Verner Chol, CNM at 08/14/2018 8:03 AM   Status: Signed    Please call patient and discuss migraine with aura risks with OCP. She needs to change to POP per her description of headache.

## 2018-08-14 NOTE — Telephone Encounter (Signed)
Patient sent the following correspondence through MyChart. Routing to triage to assist patient with request.  Hello! You told me to tell you if I ever experienced an aura with my migraines, and I hadn't up until this morning. But this morning, after a quick and not super intense workout, I had my first aura. I wasn't sure what was happening at first - just a bunch of weird squiggly lines disrupting my vision in my right eye - but now I've got a migraine, so I put two and two together. This is the first time this has happened, so I'm not sure it's going to be a big deal or continue on going forward, but I wanted to mention it when it happened so I don't forget.

## 2018-08-14 NOTE — Telephone Encounter (Signed)
Please call patient and discuss migraine with aura risks with OCP. She needs to change to POP per her description of headache.

## 2018-08-20 ENCOUNTER — Other Ambulatory Visit: Payer: Self-pay | Admitting: Certified Nurse Midwife

## 2018-08-20 MED ORDER — NORETHINDRONE 0.35 MG PO TABS: 1.0000 | ORAL_TABLET | Freq: Every day | ORAL | 0 refills | Status: DC

## 2018-08-20 NOTE — Telephone Encounter (Signed)
Routing to Leota Sauers, CNM     From [place], Di To Leda Min, RN Sent 08/20/2018 11:25 AM  Thank you! I will research. I definitely appreciate that I should change pills, and I'm definitely open to it (after I research!) but the month of March is going to be incredibly stressful and busy and is not the best time for me to be adding any more change. I'm happy to explain fully at my appointment (though, in brief, I'm now working for the [employer] and it's opening in March, and it's exciting but also so so much!), but I'm going to have so much going on and messing with my hormones (which causes emotional upsets, physical things, discomfort, side effects, etc - you know!) is not a great idea for me mentally while I'm already so stressed out. I would very much prefer to keep the status quo with the pill I'm on through March so that I can focus on work & get through the opening without worrying about side effects, and then I'm happy to switch, but switching while also in the middle of all of this [employer] craziness & stress would be incredibly difficult on me mentally.

## 2018-08-20 NOTE — Telephone Encounter (Signed)
Spoke with patient. Reports visual changes and nausea for approximately 15-30 minutes prior to migraine on 08/14/18. Advised patient per Leota Sauers, CNM, should not take combined OCP if you have migraines with aura, can increased risk of stroke.   Patient states she is now unsure if "it was a migraine with aura or just stress from work". Patient requesting name of medication be sent to her via MyChart so that she can research further. Rx for micronor #[id number]/[id number] to verified pharmacy, msg to patient via MyChart . Advised need to go ahead and switch to POP, use BUM for 7 days. Patient states she is unsure if she will switch medication before talking with Leota Sauers, CNM. Advised patient it is Leota Sauers, CNM recommendation to stop OCP. Offered OV with Leota Sauers, CNM for further discussion, patient declined stating she will discuss at AEX 09/17/18. Advised I will review with Leota Sauers, CNM and return call if any additional  recommendations. Patient request to be notified via MyChart.   Leota Sauers, CNM -please review.

## 2018-08-20 NOTE — Progress Notes (Signed)
See chart noted regarding Migraine with aura and need to change to POP. Rx sent. OCP discontinued.

## 2018-08-21 ENCOUNTER — Ambulatory Visit
Admission: RE | Admit: 2018-08-21 | Discharge: 2018-08-21 | Disposition: A | Discharge status: Home / Self Care | Payer: 59 | Source: Ambulatory Visit | Attending: Orthopedic Surgery | Admitting: Orthopedic Surgery

## 2018-08-21 DIAGNOSIS — M546 Pain in thoracic spine: Secondary | ICD-10-CM

## 2018-08-21 DIAGNOSIS — G8929 Other chronic pain: Secondary | ICD-10-CM

## 2018-08-21 DIAGNOSIS — M545 Low back pain, unspecified: Secondary | ICD-10-CM

## 2018-08-22 NOTE — Telephone Encounter (Signed)
Left detailed message, ok per dpr. Advised Leota Sauers, CNM reviewed MyChart message, has d/c OCP, recommends changing to POP as previously advised. Return call to office if any additional questions or to schedule OV with Leota Sauers, CNM to further discuss.   Routing to [employer], CNM.  Encounter closed.

## 2018-08-22 NOTE — Telephone Encounter (Signed)
Reviewed notes from patient. POP Rx  Signed and OCP discontinued as discussed.

## 2018-09-17 ENCOUNTER — Ambulatory Visit: Payer: BLUE CROSS/BLUE SHIELD | Admitting: Certified Nurse Midwife

## 2018-09-19 ENCOUNTER — Telehealth: Payer: Self-pay | Admitting: Certified Nurse Midwife

## 2018-09-19 ENCOUNTER — Encounter: Payer: Self-pay | Admitting: Obstetrics and Gynecology

## 2018-09-19 NOTE — Telephone Encounter (Signed)
Patient sent the following correspondence through MyChart. Routing to triage to assist patient with request.  Message   I've reached the end of my last Junel pack and I'm ready to switch to a progesterone only pill (I'm supposed to start a new pack Sunday, so I assume I would start the new pills then?). When I went to pick up the progesterone prescription, they asked for $35. I was hoping a comprable drug might be on the tier 1 list (attached) so that I can continue to take the pill without having to pay (as usual, money is tight!). I also have some questions - I know you're not seeing anyone in-office right now, but if it would be possible to speak on the phone, I would like to ask the questions!

## 2018-09-19 NOTE — Telephone Encounter (Signed)
Spoke with patient.   1. Reviewed attached list for covered alternatives from her insurance provider. Advised patient Micronor can be filled as any of the generics listed, "Camila, Deblitane, Errin, Heather, [place], Jolivette, Lyza, Nora-BE, Norethindrone 35 mcg, Norlyroc, Sharobel (generic Micronor, Nor-Q-D)". Advised patient to f/u with pharmacy, they will advise on generics they stock as this may vary with pharmacies.    2. Patient previously advised to stop OCP due to migraines with aura. Patient completed Junel OCP last wk, LMP 3/29, "does not take week of iron pills". Will plan to start POP on 08/22/18. Advised to use BUM for 7 days, take daily roughly same time. Patient is aware same pill daily, no inactive pills, can take 3 mo for cycles to adjust, may or may not have menses. Offered telephone visit with provider, patient declined at this time. Questions answered.   Routing to provider for final review. Patient is agreeable to disposition. Will close encounter.

## 2018-12-04 ENCOUNTER — Other Ambulatory Visit: Payer: Self-pay

## 2018-12-04 NOTE — Progress Notes (Signed)
28 y.o. G0P0 Single  Caucasian Fe here for annual exam. Periods normal slightly heavier one day only, no issues. Contraception POP working well, no warning signs.No more headaches since changed to POP. Emotionally doing well, but having Web visit with Psychiatrist and working well. Eating well and exercising prn. Some urinary frequency over the past few months, no urgency or pain, feels this is normal for her. Not sexually active. No back pain or nausea or fever. No other health issues today.  No LMP recorded. (Menstrual status: Other).          Sexually active: No.  The current method of family planning is OCP progesterone only.    Exercising: No.  exercise Smoker:  no  Review of Systems  Constitutional: Negative.   HENT: Negative.   Eyes: Negative.   Respiratory: Negative.   Cardiovascular: Negative.   Gastrointestinal: Negative.   Genitourinary: Negative.   Musculoskeletal: Negative.   Skin: Negative.   Neurological: Negative.   Endo/Heme/Allergies: Negative.   Psychiatric/Behavioral: Negative.     Health Maintenance: Pap:  09-03-15 neg  History of Abnormal Pap: no MMG:  2018 left breast u/s Self Breast exams: no Colonoscopy:  none BMD:   none TDaP:  2018 Shingles: no Pneumonia: no Hep C and HIV: HIV neg 2018 Labs: if needed   reports that she has never smoked. She has never used smokeless tobacco. She reports current alcohol use of about 2.0 standard drinks of alcohol per week. She reports that she does not use drugs.  Past Medical History:  Diagnosis Date  . Breast mass 07/25/2016   Pain with lump  . Fracture of left foot 2007   treated with brace  . Hx of degenerative disc disease   . Migraine    with aura  . Oligomenorrhea    better on OCP  . Tendonitis     Past Surgical History:  Procedure Laterality Date  . fishbone removal     pt swallowed fishbone & had to be surgically removed  . IRRIGATION AND DEBRIDEMENT SEBACEOUS CYST  08/2012   right shoulder  area  . WISDOM TOOTH EXTRACTION      Current Outpatient Medications  Medication Sig Dispense Refill  . COLLAGEN PO Take by mouth.    . Ibuprofen (ADVIL PO) Take by mouth as needed.    . L-THEANINE PO Take by mouth as needed.    . Loratadine 10 MG CAPS Take by mouth.    . norethindrone (MICRONOR,CAMILA,ERRIN) 0.35 MG tablet Take 1 tablet (0.35 mg total) by mouth daily. 3 Package 0  . Omega-3 Fatty Acids (FISH OIL PO) Take by mouth.    . Probiotic Product (PROBIOTIC PO) Take by mouth.     No current facility-administered medications for this visit.     Family History  Problem Relation Age of Onset  . Hyperlipidemia Mother   . Hypertension Mother   . Breast cancer Maternal Grandmother 40  . Parkinson's disease Paternal Grandmother   . Colon cancer Paternal Grandfather [id number]       age 992 in 2018    ROS:  Pertinent items are noted in HPI.  Otherwise, a comprehensive ROS was negative.  Exam:   There were no vitals taken for this visit.   Ht Readings from Last 3 Encounters:  02/07/18 5' 5.25" (1.657 m)  09/12/17 5' 5.25" (1.657 m)  07/04/17 5\' 5"  (1.651 m)    General appearance: alert, cooperative and appears stated age Head: Normocephalic, without obvious abnormality, atraumatic Neck:  no adenopathy, supple, symmetrical, trachea midline and thyroid normal to inspection and palpation Lungs: clear to auscultation bilaterally Breasts: normal appearance, no masses or tenderness, No nipple retraction or dimpling, No nipple discharge or bleeding, No axillary or supraclavicular adenopathy Heart: regular rate and rhythm Abdomen: soft, non-tender; no masses,  no organomegaly Extremities: extremities normal, atraumatic, no cyanosis or edema Skin: Skin color, texture, turgor normal. No rashes or lesions Lymph nodes: Cervical, supraclavicular, and axillary nodes normal. No abnormal inguinal nodes palpated Neurologic: Grossly normal   Pelvic: External genitalia:  no lesions               Urethra:  normal appearing urethra with no masses, tenderness or lesions              Bartholin's and Skene's: normal                 Vagina: normal appearing vagina with normal color and discharge, no lesions              Cervix: no cervical motion tenderness, no lesions and normal appearance              Pap taken: Yes.   Bimanual Exam:  Uterus:  normal size, contour, position, consistency, mobility, non-tender and anteverted              Adnexa: normal adnexa and no mass, fullness, tenderness               Rectovaginal: Confirms               Anus:  normal sphincter tone, no lesions  Chaperone present: yes  A:  Well Woman with normal exam  Contraception POP desired   Emotional well being talking with Psychiatrist(web visit)  Urinary frequency R/O UTI  Hisory of Sjogren's disease  Screening labs  P:   Reviewed health and wellness pertinent to exam  Rx Micronor see order with instructions  Continue visits as needed  Urine negative  Labs: TSH, Vitamin D, Lipid panel,CBC, CMP  Pap smear: yes   counseled on breast self exam, feminine hygiene, use and side effects of OCP's, adequate intake of calcium and vitamin D, diet and exercise  return annually or prn  An After Visit Summary was printed and given to the patient.

## 2018-12-06 ENCOUNTER — Other Ambulatory Visit (HOSPITAL_COMMUNITY)
Admission: RE | Admit: 2018-12-06 | Discharge: 2018-12-06 | Disposition: A | Discharge status: Home / Self Care | Payer: 59 | Source: Ambulatory Visit | Attending: Obstetrics & Gynecology | Admitting: Obstetrics & Gynecology

## 2018-12-06 ENCOUNTER — Ambulatory Visit (INDEPENDENT_AMBULATORY_CARE_PROVIDER_SITE_OTHER): Payer: 59 | Admitting: Certified Nurse Midwife

## 2018-12-06 ENCOUNTER — Other Ambulatory Visit: Payer: Self-pay

## 2018-12-06 ENCOUNTER — Encounter: Payer: Self-pay | Admitting: Certified Nurse Midwife

## 2018-12-06 VITALS — BP 120/80 | HR 68 | Temp 97.6°F | Resp 16 | Ht 65.0 in | Wt 127.0 lb

## 2018-12-06 DIAGNOSIS — Z3040 Encounter for surveillance of contraceptives, unspecified: Secondary | ICD-10-CM

## 2018-12-06 DIAGNOSIS — Z124 Encounter for screening for malignant neoplasm of cervix: Secondary | ICD-10-CM | POA: Insufficient documentation

## 2018-12-06 DIAGNOSIS — E559 Vitamin D deficiency, unspecified: Secondary | ICD-10-CM

## 2018-12-06 DIAGNOSIS — Z0000 Encounter for general adult medical examination without abnormal findings: Secondary | ICD-10-CM

## 2018-12-06 DIAGNOSIS — M5030 Other cervical disc degeneration, unspecified cervical region: Secondary | ICD-10-CM | POA: Insufficient documentation

## 2018-12-06 DIAGNOSIS — R350 Frequency of micturition: Secondary | ICD-10-CM

## 2018-12-06 DIAGNOSIS — Z01419 Encounter for gynecological examination (general) (routine) without abnormal findings: Secondary | ICD-10-CM

## 2018-12-06 LAB — POCT URINALYSIS DIPSTICK
Bilirubin, UA: NEGATIVE
Blood, UA: NEGATIVE
Glucose, UA: NEGATIVE
Leukocytes, UA: NEGATIVE
Nitrite, UA: NEGATIVE
Protein, UA: NEGATIVE
Urobilinogen, UA: NEGATIVE E.U./dL — AB
pH, UA: 6.5 (ref 5.0–8.0)

## 2018-12-06 MED ORDER — NORETHINDRONE 0.35 MG PO TABS: 1.0000 | ORAL_TABLET | Freq: Every day | ORAL | 12 refills | Status: DC

## 2018-12-07 LAB — LIPID PANEL
Chol/HDL Ratio: 2.5 ratio (ref 0.0–4.4)
Cholesterol, Total: 149 mg/dL (ref 100–199)
HDL: 59 mg/dL (ref >39)
LDL Calculated: 76 mg/dL (ref 0–99)
Triglycerides: 68 mg/dL (ref 0–149)
VLDL Cholesterol Cal: 14 mg/dL (ref 5–40)

## 2018-12-07 LAB — CBC
Hematocrit: 40.2 % (ref 34.0–46.6)
Hemoglobin: 14.2 g/dL (ref 11.1–15.9)
MCH: 31.1 pg (ref 26.6–33.0)
MCHC: 35.3 g/dL (ref 31.5–35.7)
MCV: 88 fL (ref 79–97)
Platelets: 252 10*3/uL (ref 150–450)
RBC: 4.56 x10E6/uL (ref 3.77–5.28)
RDW: 12.4 % (ref 11.7–15.4)
WBC: 6.7 10*3/uL (ref 3.4–10.8)

## 2018-12-07 LAB — COMPREHENSIVE METABOLIC PANEL
ALT: 16 IU/L (ref 0–32)
AST: 18 IU/L (ref 0–40)
Albumin/Globulin Ratio: 1.8 (ref 1.2–2.2)
Albumin: 4.5 g/dL (ref 3.9–5.0)
Alkaline Phosphatase: 55 IU/L (ref 39–117)
BUN/Creatinine Ratio: 15 (ref 9–23)
BUN: 10 mg/dL (ref 6–20)
Bilirubin Total: 0.7 mg/dL (ref 0.0–1.2)
CO2: 24 mmol/L (ref 20–29)
Calcium: 9.6 mg/dL (ref 8.7–10.2)
Chloride: 100 mmol/L (ref 96–106)
Creatinine, Ser: 0.68 mg/dL (ref 0.57–1.00)
GFR calc Af Amer: 136 mL/min/{1.73_m2} (ref >59)
GFR calc non Af Amer: 118 mL/min/{1.73_m2} (ref >59)
Globulin, Total: 2.5 g/dL (ref 1.5–4.5)
Glucose: 87 mg/dL (ref 65–99)
Potassium: 4.9 mmol/L (ref 3.5–5.2)
Sodium: 138 mmol/L (ref 134–144)
Total Protein: 7 g/dL (ref 6.0–8.5)

## 2018-12-07 LAB — VITAMIN D 25 HYDROXY (VIT D DEFICIENCY, FRACTURES): Vit D, 25-Hydroxy: 32 ng/mL (ref 30.0–100.0)

## 2018-12-07 LAB — TSH: TSH: 3.07 u[IU]/mL (ref 0.450–4.500)

## 2018-12-10 LAB — CYTOLOGY - PAP
Diagnosis: NEGATIVE
HPV: NOT DETECTED

## 2019-04-07 ENCOUNTER — Telehealth: Payer: Self-pay | Admitting: Certified Nurse Midwife

## 2019-04-07 NOTE — Telephone Encounter (Signed)
Dawn [name], CNM -please review and advise.  

## 2019-04-07 NOTE — Telephone Encounter (Signed)
Patient sent the following correspondence through [place].  Hello! I am thinking about taking a multivitamin, but there are so many out there that I wanted to ask for a recommendation. I'm currently taking Move Free Collagen, Vit D, fish oil, magnesium glycinate, and a probiotic, as well as the norethindrone. Is there anything I should look for in a multivitamin, or any brands that you'd recommend? It would be great if it could contain something to help with hair growth, as my hair is currently very thin and brittle and I've read that vitamins sometimes help with that, but I think it would probably be a good idea to start taking a multivitamin anyway, even if it doesn't help with my hair. Thanks!

## 2019-04-07 NOTE — Telephone Encounter (Signed)
I would recommend New [place] multivitamin for women. This is a good choice and tolerated well.

## 2019-04-08 NOTE — Telephone Encounter (Signed)
Spoke with patient, advised per Deborah Leonard, CNM. Patient verbalizes understanding and is agreeable.   Encounter closed.  

## 2019-08-01 IMAGING — MR MR LUMBAR SPINE W/O CM
5 series · 46 of 48 positions shown · non-contrast
Comparison: Thoracic spine MRI today reported separately.

CLINICAL DATA: 27-year-old female with thoracolumbar spine pain.
Pain between the shoulder blades and along the right SI joint for 5
months.

EXAM:
MRI LUMBAR SPINE WITHOUT CONTRAST
TECHNIQUE: Multiplanar, multisequence MR imaging of the lumbar spine was
performed. No intravenous contrast was administered.

[Series 3: tirm sag · sagittal · 4.0mm · 0.55mm/px · 6 of 12 slices shown]
[im 1/12]
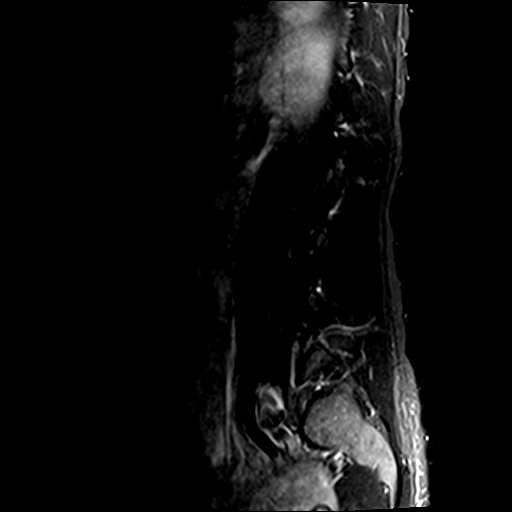
[im 3/12]
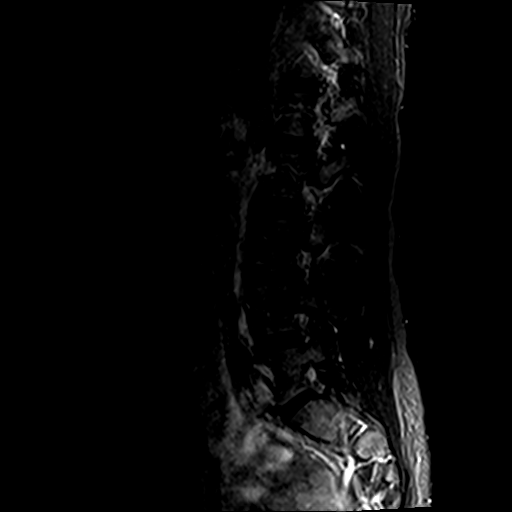
[im 5/12]
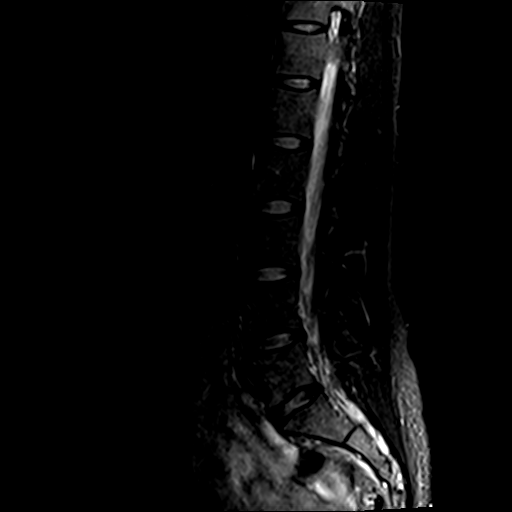
[im 7/12]
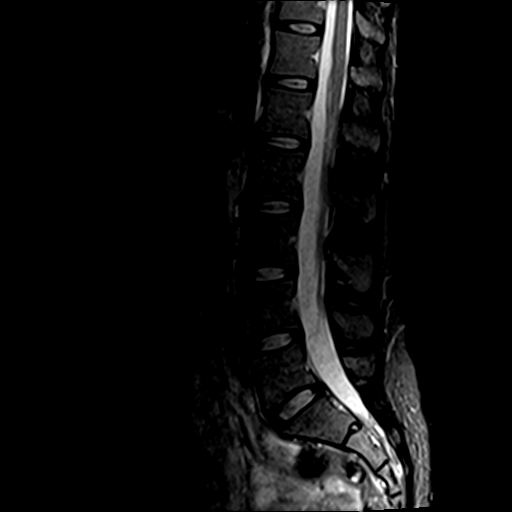
[im 9/12]
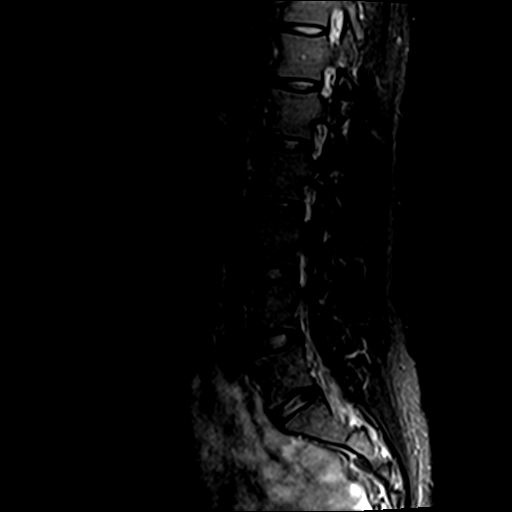
[im 12/12]
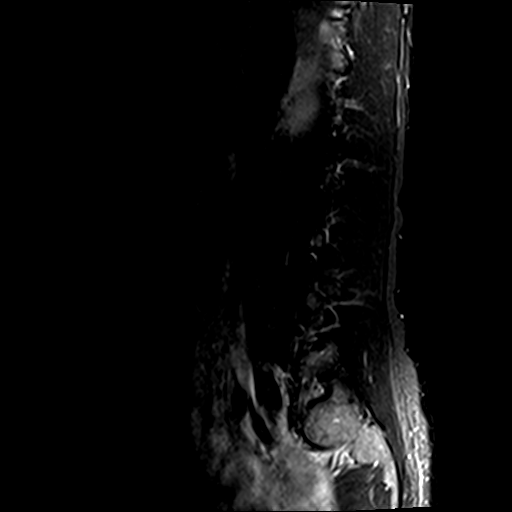

[Series 4: T1 · sagittal · 4.0mm · 0.88mm/px · 5 of 12 slices shown (1 of 2)]
[im 1/12]
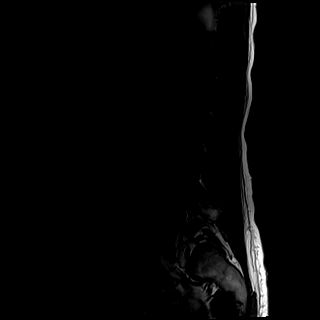
[im 3/12]
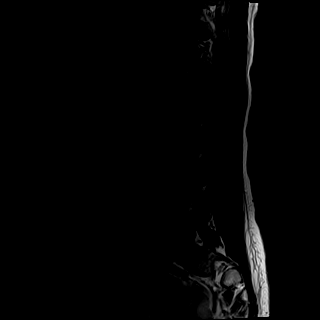
[im 6/12]
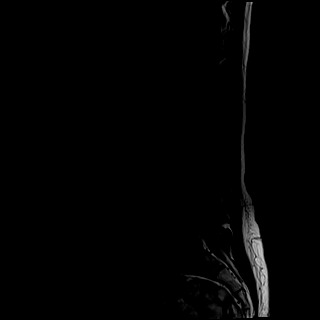
[im 9/12]
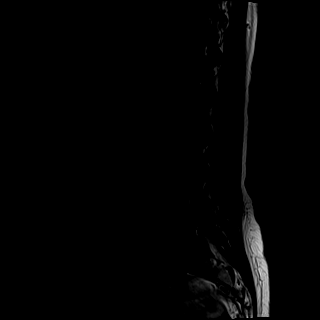
[im 12/12]
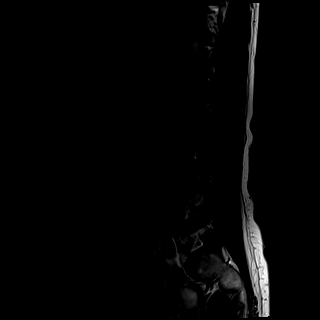

[Series 5: T1 · axial · 4.0mm · 0.78mm/px · z∈[-121,+98]mm · 14 of 36 slices shown (2 of 2)]
[im 1/36]
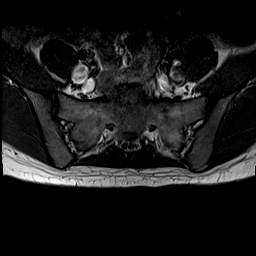
[im 3/36]
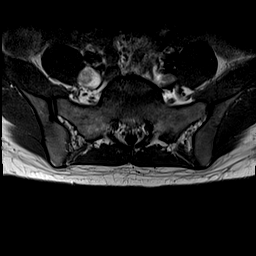
[im 5/36]
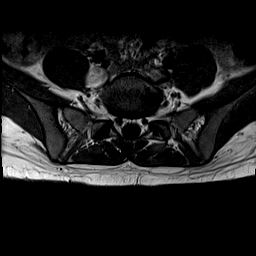
[im 8/36]
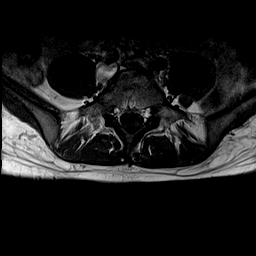
[im 10/36]
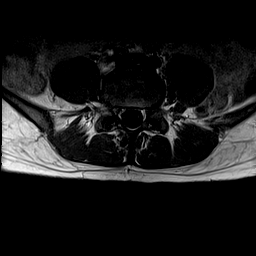
[im 12/36]
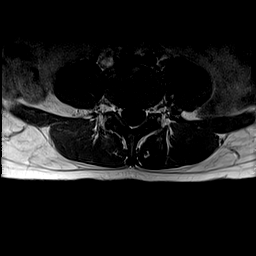
[im 15/36]
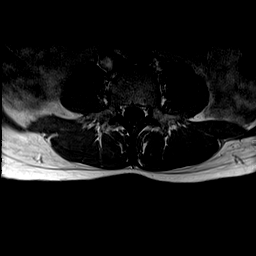
[im 17/36]
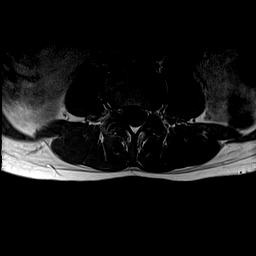
[im 19/36]
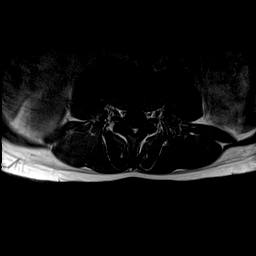
[im 22/36]
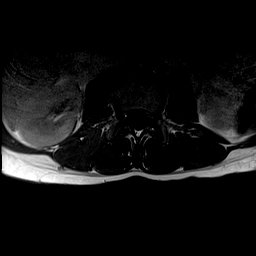
[im 24/36]
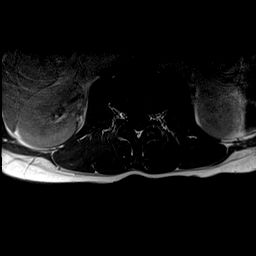
[im 26/36]
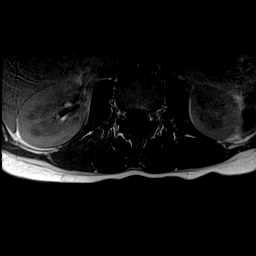
[im 31/36]
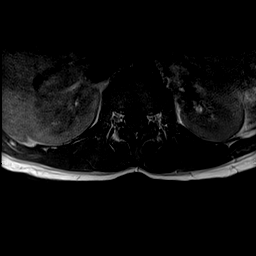
[im 36/36]
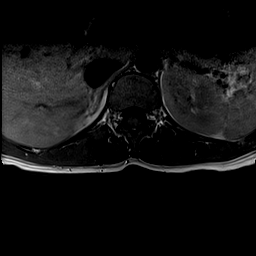

[Series 6: T2 · axial · 4.0mm · 0.78mm/px · z∈[-121,+98]mm · 16 of 36 slices shown]
[im 1/36]
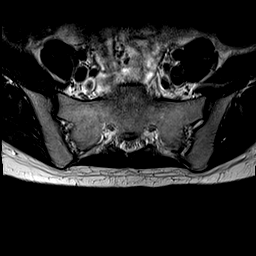
[im 3/36]
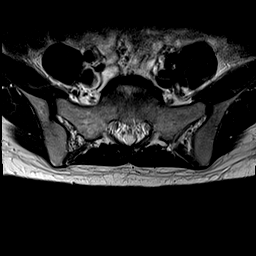
[im 5/36]
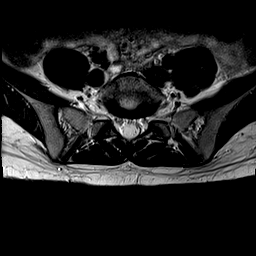
[im 8/36]
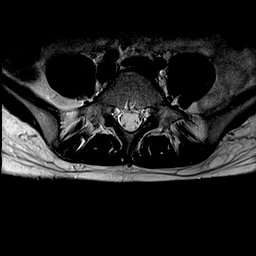
[im 10/36]
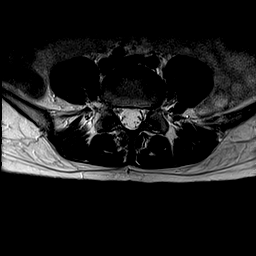
[im 12/36]
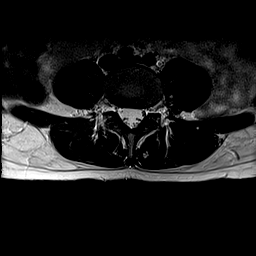
[im 15/36]
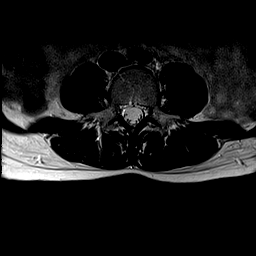
[im 17/36]
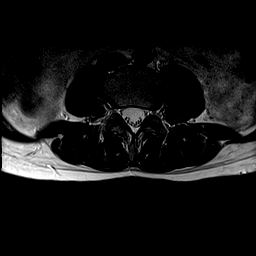
[im 19/36]
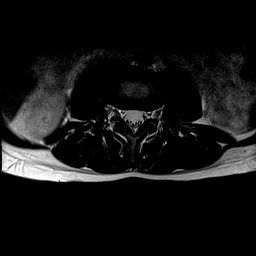
[im 22/36]
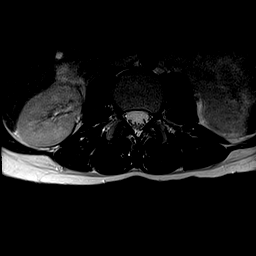
[im 24/36]
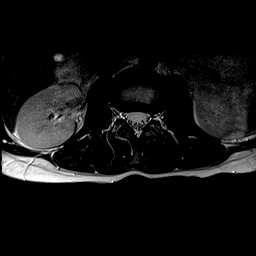
[im 26/36]
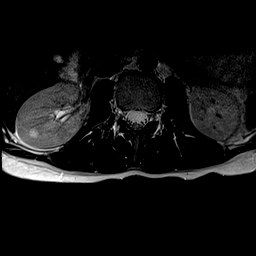
[im 29/36]
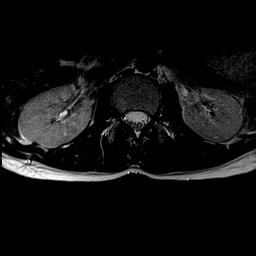
[im 31/36]
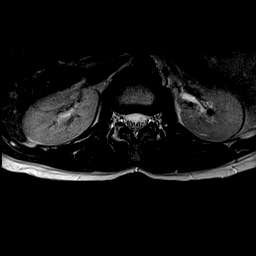
[im 33/36]
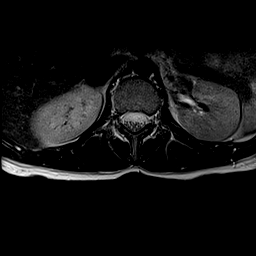
[im 36/36]
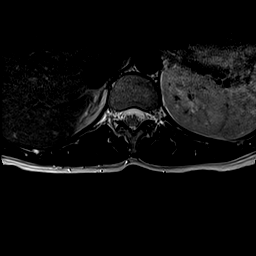

[Series 8: T2 post-contrast · sagittal · 4.0mm · 0.88mm/px · 5 of 12 slices shown]
[im 1/12]
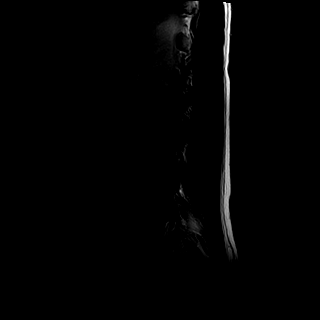
[im 3/12]
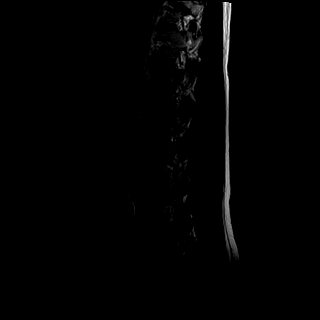
[im 6/12]
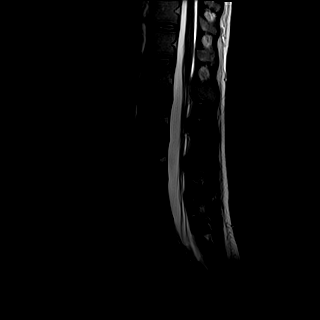
[im 9/12]
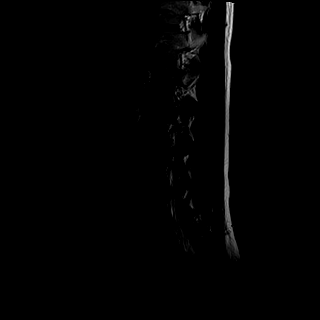
[im 12/12]
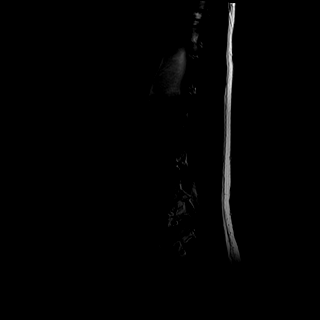

[46 of 48 positions shown; findings below may reference images not displayed]

FINDINGS: Segmentation: Normal, concordant with the thoracic spine numbering
today.

Alignment:  Normal lumbar lordosis.  No spondylolisthesis.

Vertebrae: No marrow edema or evidence of acute osseous abnormality.
Visualized bone marrow signal is within normal limits. Intact
visible sacrum and SI joints.

Conus medullaris and cauda equina: Conus extends to the T12-L1
level. Conus and cauda equina appear normal.

Paraspinal and other soft tissues: Negative visible abdominal
viscera. Negative visualized posterior paraspinal soft tissues.

Disc levels:

T12-L1:  Negative.

L1-L2:  Negative.

L2-L3:  Negative.

L3-L4:  Negative.

L4-L5:  Negative.

L5-S1:  Negative.
IMPRESSION: Normal MRI appearance of the lumbar spine.

## 2019-09-01 ENCOUNTER — Encounter: Payer: Self-pay | Admitting: Certified Nurse Midwife

## 2019-09-03 ENCOUNTER — Encounter: Payer: Self-pay | Admitting: Certified Nurse Midwife

## 2019-09-18 ENCOUNTER — Telehealth: Payer: Self-pay | Admitting: Obstetrics and Gynecology

## 2019-09-18 NOTE — Telephone Encounter (Signed)
Spoke with pharmacy. They stated that they have prescription ready for patient to pick up.  Tried calling patient back, no answer. Left a message on patient's voicemail to notify. Okay to close encounter.

## 2019-09-18 NOTE — Telephone Encounter (Signed)
Patient requesting refill on norethindrone. [facility] at [phone]

## 2019-11-14 ENCOUNTER — Other Ambulatory Visit: Payer: Self-pay | Admitting: Obstetrics and Gynecology

## 2019-11-14 DIAGNOSIS — Z3040 Encounter for surveillance of contraceptives, unspecified: Secondary | ICD-10-CM

## 2019-11-14 MED ORDER — NORETHINDRONE 0.35 MG PO TABS: 1.0000 | ORAL_TABLET | Freq: Every day | ORAL | 1 refills | Status: DC

## 2019-11-14 NOTE — Telephone Encounter (Signed)
Patient would like to speak with nurse about refill for norethindrone. Is aware could take up to 24-48 hrs for refill. [facility] [phone]

## 2019-11-14 NOTE — Telephone Encounter (Signed)
Spoke with patient. Patient is requesting a refill of POP until next AEX. Confirmed pharmacy on file. She will run out of medication on 5/30. Advised patient I will send request to Dr. Oscar La to review. F/u with pharmacy later today to see if Rx is available. Patient is agreeable.   Rx pended for Micronor #[id number]/0RF  Routing to Dr. Oscar La.

## 2019-11-14 NOTE — Telephone Encounter (Signed)
Left message to call Noreene Larsson, RN at [facility] [phone].    Last AEX 12/06/18 w/ Leota Sauers, CNM Next AEX 03/11/20 w/ Dr. Oscar La  Rx sent on 12/06/18 #1/12RF

## 2019-12-12 ENCOUNTER — Ambulatory Visit: Payer: 59 | Admitting: Certified Nurse Midwife

## 2020-03-10 NOTE — Progress Notes (Signed)
29 y.o. G0P0 Single White or Caucasian Not Hispanic or Latino female here for annual exam.  On POP. Never sexually active. Period Cycle (Days): 28 Period Duration (Days): 4-5 Period Pattern: Regular Menstrual Flow: Heavy, Moderate Menstrual Control: Tampon (regular tampon) Menstrual Control Change Freq (Hours): 2 Dysmenorrhea: (!) Mild Dysmenorrhea Symptoms: Cramping  Patient's last menstrual period was 03/05/2020.          Sexually active: No.  The current method of family planning is oral progesterone-only contraceptive.    Exercising: Yes.    Gym weight and cardio Smoker:  no  Health Maintenance: Pap:   12/06/18 [facility] HPV Neg 09-03-15 neg    History of abnormal Pap:  no MMG:  NA BMD:   NA Colonoscopy: Never TDaP:  09/12/16  Gardasil: no, counseled, she will consider.     reports that she has never smoked. She has never used smokeless tobacco. She reports current alcohol use of about 2.0 standard drinks of alcohol per week. She reports that she does not use drugs. She has a desk job in Psychologist, clinical.   Past Medical History:  Diagnosis Date  . Breast mass 07/25/2016   Pain with lump  . Fracture of left foot 2007   treated with brace  . Hx of degenerative disc disease   . Migraine    with aura  . Oligomenorrhea    better on OCP  . Tendonitis     Past Surgical History:  Procedure Laterality Date  . fishbone removal     pt swallowed fishbone & had to be surgically removed  . IRRIGATION AND DEBRIDEMENT SEBACEOUS CYST  08/2012   right shoulder area  . WISDOM TOOTH EXTRACTION      Current Outpatient Medications  Medication Sig Dispense Refill  . COLLAGEN PO Take by mouth.    . Ibuprofen (ADVIL PO) Take by mouth as needed.    . Loratadine 10 MG CAPS Take by mouth.    Marland Kitchen MAGNESIUM PO Take by mouth.    . MELATONIN PO Take by mouth.    . norethindrone (MICRONOR) 0.35 MG tablet Take 1 tablet (0.35 mg total) by mouth daily. 3 Package 1  . Omega-3 Fatty Acids (FISH OIL PO) Take  by mouth.    Marland Kitchen VITAMIN D PO Take by mouth.     No current facility-administered medications for this visit.    Family History  Problem Relation Age of Onset  . Hyperlipidemia Mother   . Hypertension Mother   . Breast cancer Maternal Grandmother 40  . Parkinson's disease Paternal Grandmother   . Colon cancer Paternal Grandfather 55       age 42 in 2018    Review of Systems  All other systems reviewed and are negative.   Exam:   BP 132/64   Pulse 86   Ht 5\' 5"  (1.651 m)   Wt 126 lb 12.8 oz (57.5 kg)   LMP 03/05/2020   SpO2 100%   BMI 21.10 kg/m   Weight change: @WEIGHTCHANGE @ Height:   Height: 5\' 5"  (165.1 cm)  Ht Readings from Last 3 Encounters:  03/11/20 5\' 5"  (1.651 m)  12/06/18 5\' 5"  (1.651 m)  02/07/18 5' 5.25" (1.657 m)    General appearance: alert, cooperative and appears stated age Head: Normocephalic, without obvious abnormality, atraumatic Neck: no adenopathy, supple, symmetrical, trachea midline and thyroid normal to inspection and palpation Lungs: clear to auscultation bilaterally Cardiovascular: regular rate and rhythm Breasts: normal appearance, no masses or tenderness Abdomen: soft, non-tender; non  distended,  no masses,  no organomegaly Extremities: extremities normal, atraumatic, no cyanosis or edema Skin: Skin color, texture, turgor normal. No rashes or lesions Lymph nodes: Cervical, supraclavicular, and axillary nodes normal. No abnormal inguinal nodes palpated Neurologic: Grossly normal   Pelvic: External genitalia:  no lesions              Urethra:  normal appearing urethra with no masses, tenderness or lesions              Bartholins and Skenes: normal                 Vagina: normal appearing vagina with normal color and discharge, no lesions              Cervix: no lesions               Bimanual Exam:  Uterus:  normal size, contour, position, consistency, mobility, non-tender              Adnexa: no mass, fullness, tenderness                Rectovaginal: declined  Carolynn Serve chaperoned for the exam.  A:  Well Woman with normal exam  On POP, doing well  Vegetarian diet  Vit d def  P:   No pap this year  Discussed breast self exam  Discussed calcium and vit D intake  Continue OCP's  She will return for screening fasting labs, vit d, vit B12

## 2020-03-11 ENCOUNTER — Ambulatory Visit: Payer: 59 | Admitting: Obstetrics and Gynecology

## 2020-03-11 ENCOUNTER — Encounter: Payer: Self-pay | Admitting: Obstetrics and Gynecology

## 2020-03-11 ENCOUNTER — Other Ambulatory Visit: Payer: Self-pay

## 2020-03-11 VITALS — BP 132/64 | HR 86 | Ht 65.0 in | Wt 126.8 lb

## 2020-03-11 DIAGNOSIS — Z3040 Encounter for surveillance of contraceptives, unspecified: Secondary | ICD-10-CM

## 2020-03-11 DIAGNOSIS — Z01419 Encounter for gynecological examination (general) (routine) without abnormal findings: Secondary | ICD-10-CM | POA: Diagnosis not present

## 2020-03-11 DIAGNOSIS — E559 Vitamin D deficiency, unspecified: Secondary | ICD-10-CM

## 2020-03-11 DIAGNOSIS — Z3041 Encounter for surveillance of contraceptive pills: Secondary | ICD-10-CM

## 2020-03-11 DIAGNOSIS — Z789 Other specified health status: Secondary | ICD-10-CM

## 2020-03-11 DIAGNOSIS — Z0000 Encounter for general adult medical examination without abnormal findings: Secondary | ICD-10-CM | POA: Diagnosis not present

## 2020-03-11 MED ORDER — NORETHINDRONE 0.35 MG PO TABS: 1.0000 | ORAL_TABLET | Freq: Every day | ORAL | 3 refills | Status: DC

## 2020-03-11 NOTE — Patient Instructions (Signed)
EXERCISE AND DIET:  We recommended that you start or continue a regular exercise program for good health. Regular exercise means any activity that makes your heart beat faster and makes you sweat.  We recommend exercising at least 30 minutes per day at least 3 days a week, preferably 4 or 5.  We also recommend a diet low in fat and sugar.  Inactivity, poor dietary choices and obesity can cause diabetes, heart attack, stroke, and kidney damage, among others.    ALCOHOL AND SMOKING:  Women should limit their alcohol intake to no more than 7 drinks/beers/glasses of wine (combined, not each!) per week. Moderation of alcohol intake to this level decreases your risk of breast cancer and liver damage. And of course, no recreational drugs are part of a healthy lifestyle.  And absolutely no smoking or even second hand smoke. Most people know smoking can cause heart and lung diseases, but did you know it also contributes to weakening of your bones? Aging of your skin?  Yellowing of your teeth and nails?  CALCIUM AND VITAMIN D:  Adequate intake of calcium and Vitamin D are recommended.  The recommendations for exact amounts of these supplements seem to change often, but generally speaking 1,000 mg of calcium (between diet and supplement) and 800 units of Vitamin D per day seems prudent. Certain women may benefit from higher intake of Vitamin D.  If you are among these women, your doctor will have told you during your visit.    PAP SMEARS:  Pap smears, to check for cervical cancer or precancers,  have traditionally been done yearly, although recent scientific advances have shown that most women can have pap smears less often.  However, every woman still should have a physical exam from her gynecologist every year. It will include a breast check, inspection of the vulva and vagina to check for abnormal growths or skin changes, a visual exam of the cervix, and then an exam to evaluate the size and shape of the uterus and  ovaries.  And after 31 years of age, a rectal exam is indicated to check for rectal cancers. We will also provide age appropriate advice regarding health maintenance, like when you should have certain vaccines, screening for sexually transmitted diseases, bone density testing, colonoscopy, mammograms, etc.   MAMMOGRAMS:  All women over 40 years old should have a yearly mammogram. Many facilities now offer a "3D" mammogram, which may cost around $50 extra out of pocket. If possible,  we recommend you accept the option to have the 3D mammogram performed.  It both reduces the number of women who will be called back for extra views which then turn out to be normal, and it is better than the routine mammogram at detecting truly abnormal areas.    COLON CANCER SCREENING: Now recommend starting at age 45. At this time colonoscopy is not covered for routine screening until 50. There are take home tests that can be done between 45-49.   COLONOSCOPY:  Colonoscopy to screen for colon cancer is recommended for all women at age 50.  We know, you hate the idea of the prep.  We agree, BUT, having colon cancer and not knowing it is worse!!  Colon cancer so often starts as a polyp that can be seen and removed at colonscopy, which can quite literally save your life!  And if your first colonoscopy is normal and you have no family history of colon cancer, most women don't have to have it again for   10 years.  Once every ten years, you can do something that may end up saving your life, right?  We will be happy to help you get it scheduled when you are ready.  Be sure to check your insurance coverage so you understand how much it will cost.  It may be covered as a preventative service at no cost, but you should check your particular policy.      Breast Self-Awareness Breast self-awareness means being familiar with how your breasts look and feel. It involves checking your breasts regularly and reporting any changes to your  health care provider. Practicing breast self-awareness is important. A change in your breasts can be a sign of a serious medical problem. Being familiar with how your breasts look and feel allows you to find any problems early, when treatment is more likely to be successful. All women should practice breast self-awareness, including women who have had breast implants. How to do a breast self-exam One way to learn what is normal for your breasts and whether your breasts are changing is to do a breast self-exam. To do a breast self-exam: Look for Changes  1. Remove all the clothing above your waist. 2. Stand in front of a mirror in a room with good lighting. 3. Put your hands on your hips. 4. Push your hands firmly downward. 5. Compare your breasts in the mirror. Look for differences between them (asymmetry), such as: ? Differences in shape. ? Differences in size. ? Puckers, dips, and bumps in one breast and not the other. 6. Look at each breast for changes in your skin, such as: ? Redness. ? Scaly areas. 7. Look for changes in your nipples, such as: ? Discharge. ? Bleeding. ? Dimpling. ? Redness. ? A change in position. Feel for Changes Carefully feel your breasts for lumps and changes. It is best to do this while lying on your back on the floor and again while sitting or standing in the shower or tub with soapy water on your skin. Feel each breast in the following way:  Place the arm on the side of the breast you are examining above your head.  Feel your breast with the other hand.  Start in the nipple area and make  inch (2 cm) overlapping circles to feel your breast. Use the pads of your three middle fingers to do this. Apply light pressure, then medium pressure, then firm pressure. The light pressure will allow you to feel the tissue closest to the skin. The medium pressure will allow you to feel the tissue that is a little deeper. The firm pressure will allow you to feel the tissue  close to the ribs.  Continue the overlapping circles, moving downward over the breast until you feel your ribs below your breast.  Move one finger-width toward the center of the body. Continue to use the  inch (2 cm) overlapping circles to feel your breast as you move slowly up toward your collarbone.  Continue the up and down exam using all three pressures until you reach your armpit.  Write Down What You Find  Write down what is normal for each breast and any changes that you find. Keep a written record with breast changes or normal findings for each breast. By writing this information down, you do not need to depend only on memory for size, tenderness, or location. Write down where you are in your menstrual cycle, if you are still menstruating. If you are having trouble noticing differences   in your breasts, do not get discouraged. With time you will become more familiar with the variations in your breasts and more comfortable with the exam. How often should I examine my breasts? Examine your breasts every month. If you are breastfeeding, the best time to examine your breasts is after a feeding or after using a breast pump. If you menstruate, the best time to examine your breasts is 5-7 days after your period is over. During your period, your breasts are lumpier, and it may be more difficult to notice changes. When should I see my health care provider? See your health care provider if you notice:  A change in shape or size of your breasts or nipples.  A change in the skin of your breast or nipples, such as a reddened or scaly area.  Unusual discharge from your nipples.  A lump or thick area that was not there before.  Pain in your breasts.  Anything that concerns you.  

## 2020-03-15 ENCOUNTER — Other Ambulatory Visit: Payer: Self-pay

## 2020-03-15 ENCOUNTER — Other Ambulatory Visit (INDEPENDENT_AMBULATORY_CARE_PROVIDER_SITE_OTHER): Payer: 59

## 2020-03-15 DIAGNOSIS — Z789 Other specified health status: Secondary | ICD-10-CM

## 2020-03-15 DIAGNOSIS — Z0000 Encounter for general adult medical examination without abnormal findings: Secondary | ICD-10-CM

## 2020-03-15 DIAGNOSIS — E559 Vitamin D deficiency, unspecified: Secondary | ICD-10-CM

## 2020-03-16 LAB — LIPID PANEL
Chol/HDL Ratio: 2.5 ratio (ref 0.0–4.4)
Cholesterol, Total: 134 mg/dL (ref 100–199)
HDL: 53 mg/dL (ref >39)
LDL Chol Calc (NIH): 66 mg/dL (ref 0–99)
Triglycerides: 76 mg/dL (ref 0–149)
VLDL Cholesterol Cal: 15 mg/dL (ref 5–40)

## 2020-03-16 LAB — COMPREHENSIVE METABOLIC PANEL
ALT: 14 IU/L (ref 0–32)
AST: 20 IU/L (ref 0–40)
Albumin/Globulin Ratio: 1.7 (ref 1.2–2.2)
Albumin: 4.7 g/dL (ref 3.8–4.8)
Alkaline Phosphatase: 50 IU/L (ref 44–121)
BUN/Creatinine Ratio: 20 (ref 9–23)
BUN: 12 mg/dL (ref 6–20)
Bilirubin Total: 0.7 mg/dL (ref 0.0–1.2)
CO2: 24 mmol/L (ref 20–29)
Calcium: 9.6 mg/dL (ref 8.7–10.2)
Chloride: 103 mmol/L (ref 96–106)
Creatinine, Ser: 0.59 mg/dL (ref 0.57–1.00)
GFR calc Af Amer: 141 mL/min/{1.73_m2} (ref >59)
GFR calc non Af Amer: 123 mL/min/{1.73_m2} (ref >59)
Globulin, Total: 2.7 g/dL (ref 1.5–4.5)
Glucose: 82 mg/dL (ref 65–99)
Potassium: 4.9 mmol/L (ref 3.5–5.2)
Sodium: 139 mmol/L (ref 134–144)
Total Protein: 7.4 g/dL (ref 6.0–8.5)

## 2020-03-16 LAB — CBC
Hematocrit: 42.8 % (ref 34.0–46.6)
Hemoglobin: 14.1 g/dL (ref 11.1–15.9)
MCH: 29.7 pg (ref 26.6–33.0)
MCHC: 32.9 g/dL (ref 31.5–35.7)
MCV: 90 fL (ref 79–97)
Platelets: 267 10*3/uL (ref 150–450)
RBC: 4.75 x10E6/uL (ref 3.77–5.28)
RDW: 11.8 % (ref 11.7–15.4)
WBC: 5.4 10*3/uL (ref 3.4–10.8)

## 2020-03-16 LAB — VITAMIN D 25 HYDROXY (VIT D DEFICIENCY, FRACTURES): Vit D, 25-Hydroxy: 36.1 ng/mL (ref 30.0–100.0)

## 2020-03-16 LAB — VITAMIN B12: Vitamin B-12: 387 pg/mL (ref 232–1245)

## 2020-07-27 DIAGNOSIS — R040 Epistaxis: Secondary | ICD-10-CM | POA: Insufficient documentation

## 2020-08-12 ENCOUNTER — Encounter: Payer: Self-pay | Admitting: Obstetrics and Gynecology

## 2020-11-30 ENCOUNTER — Encounter: Payer: Self-pay | Admitting: Obstetrics and Gynecology

## 2020-11-30 ENCOUNTER — Telehealth: Payer: Self-pay | Admitting: Genetic Counselor

## 2020-11-30 DIAGNOSIS — Z803 Family history of malignant neoplasm of breast: Secondary | ICD-10-CM

## 2020-11-30 NOTE — Telephone Encounter (Signed)
Dr. Jertson's patient. 

## 2020-11-30 NOTE — Telephone Encounter (Signed)
Returned patient phone call to schedule genetic counseling appointment due to family history of a CHEK2 mutation. We will see her on 12/14/20 at 9am.

## 2020-12-14 ENCOUNTER — Encounter: Payer: Self-pay | Admitting: Genetic Counselor

## 2020-12-14 ENCOUNTER — Inpatient Hospital Stay: Payer: 59 | Attending: Genetic Counselor | Admitting: Genetic Counselor

## 2020-12-14 ENCOUNTER — Inpatient Hospital Stay: Payer: 59

## 2020-12-14 ENCOUNTER — Other Ambulatory Visit: Payer: Self-pay

## 2020-12-14 DIAGNOSIS — Z800 Family history of malignant neoplasm of digestive organs: Secondary | ICD-10-CM | POA: Insufficient documentation

## 2020-12-14 DIAGNOSIS — Z803 Family history of malignant neoplasm of breast: Secondary | ICD-10-CM | POA: Insufficient documentation

## 2020-12-14 DIAGNOSIS — Z808 Family history of malignant neoplasm of other organs or systems: Secondary | ICD-10-CM | POA: Insufficient documentation

## 2020-12-14 DIAGNOSIS — Z8481 Family history of carrier of genetic disease: Secondary | ICD-10-CM

## 2020-12-14 LAB — GENETIC SCREENING ORDER

## 2020-12-14 NOTE — Progress Notes (Signed)
REFERRING PROVIDER: Nunzio Cobbs, MD [address] [place],  [place] [address]  PRIMARY PROVIDER:  Marda Stalker, PA-C  PRIMARY REASON FOR VISIT:  1. Family history of gene mutation   2. Family history of breast cancer   3. Family history of melanoma   4. Family history of colon cancer   5. Family history of esophageal cancer       HISTORY OF PRESENT ILLNESS:   Ms. Helmes, a 30 y.o. female, was seen for a [facility] cancer genetics consultation at the request of Dr. Yisroel Ramming due to a family history of a known CHEK2 mutation and cancer.  Ms. Hinostroza presents to clinic today to discuss the possibility of a hereditary predisposition to cancer, genetic testing, and to further clarify her future cancer risks, as well as potential cancer risks for family members.   Ms. Rumsey does not have a personal history of cancer.    RISK FACTORS:  Menarche was at age 33.  No live births.  OCP use: not asked.  Ovaries intact: yes.  Hysterectomy: no.  Menopausal status: premenopausal.  HRT use: 0 years. Colonoscopy: n/a. Mammogram within the last year: n/a. Number of breast biopsies: 0.   Past Medical History:  Diagnosis Date   Breast mass 07/25/2016   Pain with lump   Family history of breast cancer    Family history of colon cancer    Family history of esophageal cancer    Family history of gene mutation    Family history of melanoma    Fracture of left foot 2007   treated with brace   Hx of degenerative disc disease    Migraine    with aura   Oligomenorrhea    better on OCP   Tendonitis     Past Surgical History:  Procedure Laterality Date   fishbone removal     pt swallowed fishbone & had to be surgically removed   IRRIGATION AND DEBRIDEMENT SEBACEOUS CYST  08/2012   right shoulder area   WISDOM TOOTH EXTRACTION      Social History   Socioeconomic History   Marital status: Single    Spouse name: Not on file   Number of children: 0    Years of education: Not on file   Highest education level: Not on file  Occupational History   Not on file  Tobacco Use   Smoking status: Never   Smokeless tobacco: Never  Substance and Sexual Activity   Alcohol use: Yes    Alcohol/week: 2.0 standard drinks    Types: 2 Glasses of wine per week   Drug use: No   Sexual activity: Not Currently    Partners: Male    Birth control/protection: OCP  Other Topics Concern   Not on file  Social History Narrative   Not on file   Social Determinants of Health   Financial Resource Strain: Not on file  Food Insecurity: Not on file  Transportation Needs: Not on file  Physical Activity: Not on file  Stress: Not on file  Social Connections: Not on file     FAMILY HISTORY:  We obtained a detailed, 4-generation family history.  Significant diagnoses are listed below: Family History  Problem Relation Age of Onset   Hyperlipidemia Mother    Hypertension Mother    Other Mother        CHEK2 gene mutation [id number]   Melanoma Father 40       scalp  Breast cancer Maternal Aunt 73       bilateral, CHEK2 gene mutation positive   Esophageal cancer Paternal Uncle        hx smoking & chew tobacco   Breast cancer Maternal Grandmother 67   Parkinson's disease Paternal Grandmother    Colon cancer Paternal Grandfather 29       age 4 in 2018   Alzheimer's disease Paternal Grandfather        dx 30s   Ms. Dresden does not have any children or siblings.  Ms. Lemme mother is alive at age 28 without cancer, and had positive genetic testing for a CHEK2 mutation [id number]. There were two maternal aunts and two maternal uncles. One aunt was diagnosed with bilateral breast cancer at age 24 and had positive genetic testing for the CHEK2 mutation. A maternal cousin (this aunt's daughter) also tested positive for the CHEK2 mutation. Ms. Drum maternal grandmother died at age 32 and had a history of breast cancer around age 62. Her maternal grandfather died at  age 77 without cancer.  Ms. Muenchow father is alive at age 5 and has a history of melanoma diagnosed on his scalp at age 54. There are two paternal uncles. One uncle had esophageal cancer and also had a history of smoking and chewing tobacco. There is no known cancer among paternal cousins. Ms. Nadel paternal grandmother died at age 45 without cancer. Her paternal grandfather died at age 56 and had a history of colon cancer around age 61.  Ms. Andres is aware of previous family history of genetic testing for hereditary cancer risks. Patient's maternal ancestors are of [place] descent, and paternal ancestors are of [place] descent. There is no reported Ashkenazi Jewish ancestry. There is no known consanguinity.  GENETIC COUNSELING ASSESSMENT: Ms. Vignola is a 30 y.o. female with a family history of a known CHEK2 gene mutation. We, therefore, discussed and recommended the following at today's visit.   DISCUSSION:  We discussed that Ms. Mccollom has a 50% (1 in 2) chance to also have the CHEK2 variant that was discovered in her mother and other maternal relatives. We reviewed the cancer risks that are associated with CHEK2 mutations, including an increased risk of breast, colon, and prostate cancers. These estimated cancer risks vary widely and may be influenced by family history. Individuals with a CHEK2 mutation may opt for increased cancer screening for associated cancer risks per the NCCN guidelines.  We discussed that testing is beneficial for several reasons, including knowing about other cancer risks, identifying potential screening and risk-reduction options that may be appropriate, and to understand if other family members could be at risk for cancer and allow them to undergo genetic testing. We reviewed the characteristics, features and inheritance patterns of hereditary cancer syndromes. We also discussed genetic testing, including the appropriate family members to test, the process of testing, insurance  coverage, genetic discrimination, and turn-around-time for results. We discussed the implications of a negative, positive and/or variant of uncertain significant result. In particular, we discussed that even if her result is negative, she may still have a somewhat increased risk for breast cancer based on the family history.    We recommended Ms. Wollschlager pursue genetic testing for the known familial pathogenic variant in the CHEK2 gene, called [id number], through [facility] laboratories. Because Ms. Zazueta's mother had genetic testing that identified a pathogenic variant through [facility] within the last 90 days, testing of the familial variant for Ms. Pilley will be free of charge.  We discussed that some people do not want to undergo genetic testing due to fear of genetic discrimination. A federal law called the Genetic Information Non-Discrimination Act (GINA) of 2008 helps protect individuals against genetic discrimination based on their genetic test results. It impacts both health insurance and employment. With health insurance, it protects against increased premiums, being kicked off insurance or being forced to take a test in order to be insured. For employment it protects against hiring, firing and promoting decisions based on genetic test results. Health status due to a cancer diagnosis is not protected under GINA. Additionally, life, disability, and long-term care insurance is not protected under GINA.   PLAN: After considering the risks, benefits, and limitations, Ms. Kliebert provided informed consent to pursue genetic testing and the blood sample was sent to [facility] for [facility] of the CHEK2 gene. Results should be available within approximately two-three weeks' time, at which point they will be disclosed by telephone to Ms. Staszewski, as will any additional recommendations warranted by these results. Ms. Sia will receive a summary of her genetic counseling visit and a copy of her  results once available. This information will also be available in Epic.   Ms. Ferrufino questions were answered to her satisfaction today. Our contact information was provided should additional questions or concerns arise. Thank you for the referral and allowing [place] to share in the care of your patient.   Clint Guy, [place], [facility] Licensed, Certified Dispensing [email or web address] Phone: [phone]  The patient was seen for a total of 30 minutes in face-to-face genetic counseling. Patient was seen alone. This patient was discussed with Drs. Magrinat, Lindi Adie and/or Burr Medico who agrees with the above.    _______________________________________________________________________ For Office Staff:  Number of people involved in session: 1 Was an Intern/ student involved with case: no

## 2020-12-29 DIAGNOSIS — Z1379 Encounter for other screening for genetic and chromosomal anomalies: Secondary | ICD-10-CM | POA: Insufficient documentation

## 2020-12-30 ENCOUNTER — Encounter: Payer: Self-pay | Admitting: Genetic Counselor

## 2020-12-30 ENCOUNTER — Ambulatory Visit: Payer: Self-pay | Admitting: Genetic Counselor

## 2020-12-30 ENCOUNTER — Telehealth: Payer: Self-pay | Admitting: Genetic Counselor

## 2020-12-30 DIAGNOSIS — Z1589 Genetic susceptibility to other disease: Secondary | ICD-10-CM

## 2020-12-30 DIAGNOSIS — Z1501 Genetic susceptibility to malignant neoplasm of breast: Secondary | ICD-10-CM | POA: Insufficient documentation

## 2020-12-30 DIAGNOSIS — Z1509 Genetic susceptibility to other malignant neoplasm: Secondary | ICD-10-CM | POA: Insufficient documentation

## 2020-12-30 DIAGNOSIS — Z1379 Encounter for other screening for genetic and chromosomal anomalies: Secondary | ICD-10-CM

## 2020-12-30 NOTE — Progress Notes (Signed)
GENETIC TEST RESULTS   Patient Name: Dawn [name] Patient Age: 30 y.o. Encounter Date: 12/30/2020  Referring Provider: Nunzio Cobbs, MD [address] [place],  [place] [address]    [name] was seen in the [facility] on 12/14/2020 due to a family history of a known CHEK2 gene mutation and cancer, and concern regarding a hereditary predisposition to cancer in the family. Please refer to the prior Genetics clinic note for more information regarding [name]'s medical and family histories and our assessment at the time.   FAMILY HISTORY:  We obtained a detailed, 4-generation family history.  Significant diagnoses are listed below: Family History  Problem Relation Age of Onset   Hyperlipidemia Mother    Hypertension Mother    Other Mother        CHEK2 gene mutation c.409C>T   Melanoma Father 54       scalp   Breast cancer Maternal Aunt 73       bilateral, CHEK2 gene mutation positive   Esophageal cancer Paternal Uncle        hx smoking & chew tobacco   Breast cancer Maternal Grandmother 26   Parkinson's disease Paternal Grandmother    Colon cancer Paternal Grandfather 4       age 32 in 2018   Alzheimer's disease Paternal Grandfather        dx 34s   [name] does not have any children or siblings.   [name] mother is alive at age 90 without cancer, and had positive genetic testing for a CHEK2 mutation c.409C>T. There were two maternal aunts and two maternal uncles. One aunt was diagnosed with bilateral breast cancer at age 12 and had positive genetic testing for the CHEK2 mutation. A maternal cousin (this aunt's daughter) also tested positive for the CHEK2 mutation. [name] maternal grandmother died at age 48 and had a history of breast cancer around age 25. Her maternal grandfather died at age 18 without cancer.   [name] father is alive at age 85 and has a history of melanoma diagnosed on his scalp at age 101. There are two paternal  uncles. One uncle had esophageal cancer and also had a history of smoking and chewing tobacco. There is no known cancer among paternal cousins. [name] paternal grandmother died at age 105 without cancer. Her paternal grandfather died at age 83 and had a history of colon cancer around age 51.   [name] is aware of previous family history of genetic testing for hereditary cancer risks. Patient's maternal ancestors are of [place] descent, and paternal ancestors are of [place] descent. There is no reported Ashkenazi Jewish ancestry. There is no known consanguinity.  GENETIC TESTING: At the time of [name]'s visit, we recommended she pursue genetic testing for the specific CHEK2 mutation that was identified in the family, called p.R137* (c.409C>T). Genetic testing reported out on 12/29/2020 through [facility] of CHEK2 offered by [employer]. A single, heterozygous pathogenic variant was detected in the CHEK2 gene called p.R137* (c.409C>T).   The test report will be scanned into EPIC and located under the Molecular Pathology section of the Results Review tab. A portion of the result report is included below for reference.     DISCUSSION:  CHEK2 mutations have been found to be associated with an increased risk of breast, colon, prostate, and possibly other cancers. These estimated cancer risks vary widely and may be influenced by family history. Women with  a CHEK2 deleterious mutation have approximately a 24% (no family history of breast cancer) to 48% (strong family history of breast cancer) lifetime risk of breast cancer and up to a 25% risk of a second breast cancer. Men may have an increased risk for female breast cancer of about 1%. Men and women may have an increased risk of colon cancer, and men have an increased risk for prostate cancer, although precise lifetime risk estimates are not established. There is preliminary evidence supporting a correlation with CHEK2 and autosomal dominant  predisposition to other cancer types including urinary tract cancer, ovarian cancer and melanoma (PMID: 75449201, [id number], [id number], [id number], [id number]); however, the available evidence is insufficient to make a determination regarding these relationships.  An individual's cancer risk and medical management are not determined by genetic test results alone. Overall cancer risk assessment incorporates additional factors, including personal medical history, family history, and any available genetic information that may result in a personalized plan for cancer prevention and surveillance.   Breast Cancer Management:  Women with a deleterious CHEK2 mutation have an increased lifetime risk for breast cancer. The following is recommended for individuals with a CHEK2 mutation, according to the NCCN Guidelines (Genetic/Familial High-Risk Assessment:  Breast, Ovarian, and Pancreatic, Version 2.2022):  Screening: Annual mammogram with consideration of tomosynthesis and consider breast MRI with contrast starting at age 49. Evidence is insufficient for risk-reducing mastectomy. Manage based on family history.  We discussed the option for [name] to seen in the high risk breast cancer clinic to establish a breast screening plan with which she is comfortable. [name] is interested, and we will therefore place the referral.  Colon Cancer Management:  Men and women with a deleterious CHEK2 mutation have an increased lifetime risk for colon cancer. According to the NCCN Guidelines (Genetic/Familial High-Risk Assessment: Colorectal, Version 2.2021), the following is recommended for individuals with a CHEK2 mutation:  Personal history of colon cancer:  Manage based on personal history. No personal history of colon cancer and family history of colon cancer in a first-degree relative:  Colonoscopy every 5 years starting at age 9 or 43 years younger than the earliest age of onset, whichever is younger. No personal  history of colon cancer and no family history of colon cancer in a first-degree relative:  Colonoscopy every 5 years starting at age 32.  Prostate Cancer Management:  Although pathogenic variants in CHEK2 increase the risk for prostate cancer in men, there are no additional screening recommendations beyond what is recommended for the general population, at this time. Men with CHEK2 variants may therefore consider the following general population prostate cancer screening recommendations:  Prostate specific antigen (PSA) blood screening may be considered in men age 51-75 after discussion of risks and benefits. Consider digital rectal exam.  FAMILY MEMBERS: It is important that all of Ms. Kleckner's relatives (both men and women) know of the presence of this gene mutation. Women need to know that they may be at increased risk for breast and colon cancers. Men are at slightly increased risk for breast, prostate and colon cancers. Genetic testing can sort out who in the  family is at risk and who is not.  We would be happy to help meet with and coordinate genetic testing for any relative that is interested.  SUPPORT AND RESOURCES:  If Ms. Krupp is interested in hereditary cancer gene-specific information and support, there are two groups, Facing Our Risk (www.facingourrisk.org) and Bright Pink (www.brightpink.org) which some people have found useful. They  provide opportunities to speak with other individuals from high-risk families. To locate genetic counselors in other cities, visit the website www.FindAGeneticCounselor.com and search for a counselor by zip code.  Our knowledge of cancer risks related to CHEK2 mutations will continue to evolve. We recommended that Ms. Graley remain in contact with the genetics clinic on an annual basis so we can provide her with the most current information about CHEK2 and cancer risk, as well as document any changes to her family history (new cancer diagnoses, genetic test  results). Our contact number was provided. Ms. Steedman questions were answered to her satisfaction, and she knows she is welcome to call us at anytime with additional questions or concerns.   Clint Guy, [place], [facility] Licensed, Certified [email or web address] [email or web address] Phone: [phone]

## 2020-12-30 NOTE — Telephone Encounter (Signed)
Revealed positive genetic test results:  Ms. Smoak did inherit the CHEK2 gene mutation that was previously identified in her mother, called p.R137* (c.409C>T). We reviewed the associated cancer risk and screening recommendations.

## 2021-01-02 ENCOUNTER — Encounter: Payer: Self-pay | Admitting: Obstetrics and Gynecology

## 2021-01-03 ENCOUNTER — Telehealth: Payer: Self-pay | Admitting: Hematology and Oncology

## 2021-01-03 NOTE — Telephone Encounter (Signed)
Received a referral for Ms. Stepanian to be seen in the high risk clinic. She has been cld and scheduled to see Dr. Al Pimple on 8/3 at 1pm. Pt aware to arrive 15 minutes early.

## 2021-01-17 HISTORY — PX: OTHER SURGICAL HISTORY: SHX169

## 2021-01-18 NOTE — Progress Notes (Signed)
[facility] NOTE  Patient Care Team: Marda Stalker, PA-C as PCP - General (Family Medicine)  CHIEF COMPLAINTS/PURPOSE OF CONSULTATION:  CHEK 2 mutation.  ASSESSMENT & PLAN:   This is a very pleasant 30 year old female patient with a pathogenic heterozygous CHEK2 mutation and a family history of breast cancer referred to high-risk breast clinic for further recommendations.  We have discussed increased risk of breast cancer which can range from about 25 to 45% in patients with CHEK2 pathogenic mutation depending upon the family history of breast cancer.  We have also discussed about increased risk of colon cancer and need to proceed with the colonoscopy screening every 5 years starting at the age of 56.    Given her lifetime risk of breast cancer greater than 20%, we have discussed about annual MRIs staggering with mammogram starting at the age of 69.  We have discussed the unknown risk of gadolinium deposition over time and increased rate of biopsies with MRIs.  She is agreeable to following up with [place] once a year and starting screening mammograms and MRIs at the age of 26.   We discussed different risk reducing strategies for future breast cancer risk.  Given her young age, she is not a candidate for chemoprevention at this time. Lifestyle modification: We discussed different interventions exercise about 150 minutes a week, discussed dietary modification including increasing number of servings of fruit and vegetables as well as decreased in number of servings of meat. We discussed the importance of maintaining a good BMI and limiting alcohol intake.  For now I have encouraged her to perform self breast exam and report any changes to [place].  She will otherwise return to clinic in 1 year for repeat breast exam.  Breast exam from today without any concerns.  HISTORY OF PRESENTING ILLNESS:  Dawn [name] 30 y.o. female is here because of CHEK 2 mutation  This is a pleasant  30 year old young healthy female patient with Sjogren's syndrome and family history significant for breast cancer with recent genetic testing with results showing CHEK2 heterozygous pathogenic mutation and hence referred to high risk breast clinic.  She is here for an initial visit by herself.  She is pretty healthy except for some dry skin and dry eyes and does not take any other medication for Sjogren's.  She complains of some cysts in her left breast but no prior diagnosis of atypical hyperplasia or cancers.  She has a grandmother as well as a maternal aunt who had bilateral breast cancer and that had colon cancer in his 31s.  She is nulliparous, denies any hormone replacement therapy, currently uses progesterone only contraception to regulate menstrual cycles. Rest of the pertinent 10 point ROS reviewed and negative.  REVIEW OF SYSTEMS:   Constitutional: Denies fevers, chills or abnormal night sweats Eyes: Denies blurriness of vision, double vision or watery eyes Ears, nose, mouth, throat, and face: Denies mucositis or sore throat Respiratory: Denies cough, dyspnea or wheezes Cardiovascular: Denies palpitation, chest discomfort or lower extremity swelling Gastrointestinal:  Denies nausea, heartburn or change in bowel habits Skin: Denies abnormal skin rashes Lymphatics: Denies new lymphadenopathy or easy bruising Neurological:Denies numbness, tingling or new weaknesses Behavioral/Psych: Mood is stable, no new changes  All other systems were reviewed with the patient and are negative.  MEDICAL HISTORY:  Past Medical History:  Diagnosis Date   Breast mass 07/25/2016   Pain with lump   Family history of breast cancer    Family history of colon cancer  Family history of esophageal cancer    Family history of gene mutation    Family history of melanoma    Fracture of left foot 2007   treated with brace   Hx of degenerative disc disease    Migraine    with aura   Monoallelic mutation of  CHEK2 gene in female patient    Oligomenorrhea    better on OCP   Tendonitis     SURGICAL HISTORY: Past Surgical History:  Procedure Laterality Date   fishbone removal     pt swallowed fishbone & had to be surgically removed   IRRIGATION AND DEBRIDEMENT SEBACEOUS CYST  08/2012   right shoulder area   WISDOM TOOTH EXTRACTION      SOCIAL HISTORY: Social History   Socioeconomic History   Marital status: Single    Spouse name: Not on file   Number of children: 0   Years of education: Not on file   Highest education level: Not on file  Occupational History   Not on file  Tobacco Use   Smoking status: Never   Smokeless tobacco: Never  Substance and Sexual Activity   Alcohol use: Yes    Alcohol/week: 2.0 standard drinks    Types: 2 Glasses of wine per week   Drug use: No   Sexual activity: Not Currently    Partners: Male    Birth control/protection: OCP  Other Topics Concern   Not on file  Social History Narrative   Not on file   Social Determinants of Health   Financial Resource Strain: Not on file  Food Insecurity: Not on file  Transportation Needs: Not on file  Physical Activity: Not on file  Stress: Not on file  Social Connections: Not on file  Intimate Partner Violence: Not on file    FAMILY HISTORY: Family History  Problem Relation Age of Onset   Hyperlipidemia Mother    Hypertension Mother    Other Mother        CHEK2 gene mutation c.409C>T   Melanoma Father 59       scalp   Breast cancer Maternal Aunt 73       bilateral, CHEK2 gene mutation positive   Esophageal cancer Paternal Uncle        hx smoking & chew tobacco   Breast cancer Maternal Grandmother 48   Parkinson's disease Paternal Grandmother    Colon cancer Paternal Grandfather 3       age 48 in 2018   Alzheimer's disease Paternal Grandfather        dx 3s    ALLERGIES:  is allergic to amoxicillin, ceclor [cefaclor], penicillins, and prednisone.  MEDICATIONS:  Current Outpatient  Medications  Medication Sig Dispense Refill   COLLAGEN PO Take by mouth.     Ibuprofen (ADVIL PO) Take by mouth as needed.     Loratadine 10 MG CAPS Take by mouth.     MAGNESIUM PO Take by mouth.     MELATONIN PO Take by mouth.     norethindrone (MICRONOR) 0.35 MG tablet Take 1 tablet (0.35 mg total) by mouth daily. 84 tablet 3   Omega-3 Fatty Acids (FISH OIL PO) Take by mouth.     VITAMIN D PO Take by mouth.     No current facility-administered medications for this visit.    PHYSICAL EXAMINATION: ECOG PERFORMANCE STATUS: 0 - Asymptomatic  There were no vitals filed for this visit. There were no vitals filed for this visit.  GENERAL:alert, no distress and  comfortable SKIN: skin color, texture, turgor are normal, no rashes or significant lesions EYES: normal, conjunctiva are pink and non-injected, sclera clear OROPHARYNX:no exudate, no erythema and lips, buccal mucosa, and tongue normal  NECK: supple, thyroid normal size, non-tender, without nodularity LYMPH:  no palpable lymphadenopathy in the cervical, axillary or inguinal LUNGS: clear to auscultation and percussion with normal breathing effort HEART: regular rate & rhythm and no murmurs and no lower extremity edema ABDOMEN:abdomen soft, non-tender and normal bowel sounds Musculoskeletal:no cyanosis of digits and no clubbing  PSYCH: alert & oriented x 3 with fluent speech NEURO: no focal motor/sensory deficits  LABORATORY DATA:  I have reviewed the data as listed Lab Results  Component Value Date   WBC 5.4 03/15/2020   HGB 14.1 03/15/2020   HCT 42.8 03/15/2020   MCV 90 03/15/2020   PLT 267 03/15/2020     Chemistry      Component Value Date/Time   NA 139 03/15/2020 0843   K 4.9 03/15/2020 0843   CL 103 03/15/2020 0843   CO2 24 03/15/2020 0843   BUN 12 03/15/2020 0843   CREATININE 0.59 03/15/2020 0843   CREATININE 0.72 09/12/2016 0820      Component Value Date/Time   CALCIUM 9.6 03/15/2020 0843   ALKPHOS 50  03/15/2020 0843   AST 20 03/15/2020 0843   ALT 14 03/15/2020 0843   BILITOT 0.7 03/15/2020 0843       RADIOGRAPHIC STUDIES: I have personally reviewed the radiological images as listed and agreed with the findings in the report. No results found.  All questions were answered. The patient knows to call the clinic with any problems, questions or concerns. I spent 45 minutes in the care of this patient including H and P, review of records, counseling and coordination of care. Please review my above-mentioned assessment and plan for detailed discussion.    Benay Pike, MD 01/18/2021 10:04 PM

## 2021-01-19 ENCOUNTER — Other Ambulatory Visit: Payer: Self-pay

## 2021-01-19 ENCOUNTER — Inpatient Hospital Stay: Payer: 59 | Attending: Genetic Counselor | Admitting: Hematology and Oncology

## 2021-01-19 ENCOUNTER — Encounter: Payer: Self-pay | Admitting: Hematology and Oncology

## 2021-01-19 VITALS — BP 120/70 | HR 75 | Temp 98.8°F | Resp 20 | Wt 132.0 lb

## 2021-01-19 DIAGNOSIS — Z1501 Genetic susceptibility to malignant neoplasm of breast: Secondary | ICD-10-CM | POA: Diagnosis not present

## 2021-01-19 DIAGNOSIS — Z803 Family history of malignant neoplasm of breast: Secondary | ICD-10-CM | POA: Insufficient documentation

## 2021-01-19 DIAGNOSIS — Z800 Family history of malignant neoplasm of digestive organs: Secondary | ICD-10-CM | POA: Diagnosis not present

## 2021-01-19 DIAGNOSIS — Z1509 Genetic susceptibility to other malignant neoplasm: Secondary | ICD-10-CM

## 2021-01-19 DIAGNOSIS — Z9189 Other specified personal risk factors, not elsewhere classified: Secondary | ICD-10-CM | POA: Insufficient documentation

## 2021-01-19 DIAGNOSIS — Z808 Family history of malignant neoplasm of other organs or systems: Secondary | ICD-10-CM | POA: Diagnosis not present

## 2021-01-19 DIAGNOSIS — M3500 Sicca syndrome, unspecified: Secondary | ICD-10-CM

## 2021-01-24 ENCOUNTER — Encounter: Payer: Self-pay | Admitting: Allergy

## 2021-01-24 ENCOUNTER — Other Ambulatory Visit: Payer: Self-pay

## 2021-01-24 ENCOUNTER — Ambulatory Visit (INDEPENDENT_AMBULATORY_CARE_PROVIDER_SITE_OTHER): Payer: 59 | Admitting: Allergy

## 2021-01-24 VITALS — BP 118/88 | HR 82 | Temp 98.5°F | Resp 16 | Ht 65.0 in | Wt 132.2 lb

## 2021-01-24 DIAGNOSIS — L239 Allergic contact dermatitis, unspecified cause: Secondary | ICD-10-CM | POA: Insufficient documentation

## 2021-01-24 NOTE — Progress Notes (Signed)
New Patient Note  RE: Dawn [name] MRN: [id number] DOB: [birth year] Date of Office Visit: 01/24/2021  Consult requested by: Maeola Harman, MD Primary care provider: Pcp, No  Chief Complaint: Allergy Testing (Wants to do metal patch test as sh is to have surgery on her neck. Says she has had issues with metals in the past. States her surgery is August 26. )  History of Present Illness: I had the pleasure of seeing Dawn [name] for initial evaluation at the [facility] on 01/24/2021. She is a 30 y.o. female, who is referred here by Dr. Venetia Maxon (neurosurgery) for the evaluation of metal sensitivity.  Patient is scheduled for artificial disc replacement of the neck on 8/26/20221 - possibly containing titanium and zirconia toughened alumina ceramic? Patient had issues with breaking out in rashes as a child from jewelry, belt buckles. She is not sure what exact metals bothered her.  Patient has no issues now with nickel, silver, gold necklaces, or rings. Able to wear glasses with no issues.  Still avoiding earrings as when she got them re-pierced there was some issues with healing/infections.  Prior joint replacements or metals in the body: No.  Assessment and Plan: Dawn [name] is a 30 y.o. female with: Allergic contact dermatitis History of breaking out in rashes as a child from jewelry. Now no issues except for earrings cause irritation. Scheduled for artificial disc replacement on the neck with possibly Nickel and zirconia containing products.  Discussed with patient that we don't have zirconia to patch test for. The metal patch test does include nickel and other metals. Metal patches placed today. Please avoid strenuous physical activities and do not get the patches on the back wet. No showering until final patch reading done. Okay to take antihistamines for itching but avoid placing any creams on the back where the patches are. We will remove the patches on Wednesday and will do  our initial read. Then you will come back on Friday for a final read.  Return in about 2 days (around 01/26/2021) for Patch reading.  No orders of the defined types were placed in this encounter.  Lab Orders  No laboratory test(s) ordered today    Other allergy screening: Asthma: no Rhino conjunctivitis: yes Mild rhinitis symptoms and had testing in the past which was negative per patient report.  Food allergy: yes Avoiding dairy due GI issues. Medication allergy: yes Hymenoptera allergy: no Urticaria: stress induced Eczema:no History of recurrent infections suggestive of immunodeficency: no  Diagnostics:   Metals Patch - 01/24/21 0918     Time Antigen Placed 7885    Manufacturer Other   [employer]   Location Back    Number of Test 11    Lot # [id number]    Reading Interval Day 1;Day 3;Day 5             Past Medical History: Patient Active Problem List   Diagnosis Date Noted   Allergic contact dermatitis 01/24/2021   Monoallelic mutation of CHEK2 gene in female patient 12/30/2020   Genetic testing 12/29/2020   Family history of gene mutation 12/14/2020   Family history of breast cancer 12/14/2020   Family history of melanoma 12/14/2020   Family history of colon cancer 12/14/2020   Family history of esophageal cancer 12/14/2020   Degenerative disc disease, cervical 12/06/2018   Dysphagia, idiopathic 07/13/2017   Gastroesophageal reflux disease without esophagitis 07/13/2017   History of Sjogren's disease (HCC) 07/13/2017   Past Medical History:  Diagnosis Date   Breast mass 07/25/2016   Pain with lump   Family history of breast cancer    Family history of colon cancer    Family history of esophageal cancer    Family history of gene mutation    Family history of melanoma    Fracture of left foot 2007   treated with brace   Hx of degenerative disc disease    Migraine    with aura   Monoallelic mutation of CHEK2 gene in female patient     Oligomenorrhea    better on OCP   Sjogren's syndrome ([place]) 2018   Tendonitis    Past Surgical History: Past Surgical History:  Procedure Laterality Date   fishbone removal     pt swallowed fishbone & had to be surgically removed   IRRIGATION AND DEBRIDEMENT SEBACEOUS CYST  08/2012   right shoulder area   WISDOM TOOTH EXTRACTION     Medication List:  Current Outpatient Medications  Medication Sig Dispense Refill   cycloSPORINE (RESTASIS) 0.05 % ophthalmic emulsion Place 2 drops into both eyes 2 (two) times daily.     Ibuprofen (ADVIL PO) Take by mouth as needed.     L-Theanine 200 MG CAPS      MELATONIN PO Take by mouth.     norethindrone (MICRONOR) 0.35 MG tablet Take 1 tablet (0.35 mg total) by mouth daily. 84 tablet 3   VITAMIN D PO Take by mouth.     No current facility-administered medications for this visit.   Allergies: Allergies  Allergen Reactions   Amoxicillin    Ceclor [Cefaclor]    Penicillins    Prednisone Diarrhea and Nausea And Vomiting   Social History: Social History   Socioeconomic History   Marital status: Single    Spouse name: Not on file   Number of children: 0   Years of education: Not on file   Highest education level: Not on file  Occupational History   Not on file  Tobacco Use   Smoking status: Never   Smokeless tobacco: Never  Substance and Sexual Activity   Alcohol use: Yes    Alcohol/week: 2.0 standard drinks    Types: 2 Glasses of wine per week   Drug use: No   Sexual activity: Not Currently    Partners: Male    Birth control/protection: OCP  Other Topics Concern   Not on file  Social History Narrative   Not on file   Social Determinants of Health   Financial Resource Strain: Not on file  Food Insecurity: Not on file  Transportation Needs: Not on file  Physical Activity: Not on file  Stress: Not on file  Social Connections: Not on file   Lives in a [address] year old [place]home. Smoking: denies Occupation: Oncologist HistoryFreight forwarder in the house: no Charity fundraiser in the family room: no Carpet in the bedroom: no Heating: gas Cooling: central Pet: yes rats and snake  Family History: Family History  Problem Relation Age of Onset   Hyperlipidemia Mother    Hypertension Mother    Other Mother        CHEK2 gene mutation c.409C>T   Melanoma Father 77       scalp   Breast cancer Maternal Aunt 73       bilateral, CHEK2 gene mutation positive   Esophageal cancer Paternal Uncle        hx smoking & chew tobacco   Breast cancer Maternal Grandmother 70  Parkinson's disease Paternal Grandmother    Colon cancer Paternal Grandfather 20       age 16 in 2018   Alzheimer's disease Paternal Grandfather        dx 50s   Problem                               Relation Asthma                                   No  Eczema                                No  Food allergy                          No  Allergic rhino conjunctivitis     No  Review of Systems  Constitutional:  Negative for appetite change, chills, fever and unexpected weight change.  HENT:  Negative for congestion and rhinorrhea.   Eyes:  Negative for itching.  Respiratory:  Negative for cough, chest tightness, shortness of breath and wheezing.   Cardiovascular:  Negative for chest pain.  Gastrointestinal:  Negative for abdominal pain.  Genitourinary:  Negative for difficulty urinating.  Skin:  Negative for rash.  Neurological:  Negative for headaches.   Objective: BP 118/88   Pulse 82   Temp 98.5 F (36.9 C) (Temporal)   Resp 16   Ht $R'5\' 5"'yI$  (1.651 m)   Wt 132 lb 3.2 oz (60 kg)   SpO2 100%   BMI 22.00 kg/m  Body mass index is 22 kg/m. Physical Exam Vitals and nursing note reviewed.  Constitutional:      Appearance: Normal appearance. She is well-developed.  HENT:     Head: Normocephalic and atraumatic.     Right Ear: External ear normal.     Left Ear: External ear normal.     Nose: Nose normal.      Mouth/Throat:     Mouth: Mucous membranes are moist.     Pharynx: Oropharynx is clear.  Eyes:     Conjunctiva/sclera: Conjunctivae normal.  Cardiovascular:     Rate and Rhythm: Normal rate and regular rhythm.     Heart sounds: Normal heart sounds. No murmur heard.   No friction rub. No gallop.  Pulmonary:     Effort: Pulmonary effort is normal.     Breath sounds: Normal breath sounds. No wheezing, rhonchi or rales.  Abdominal:     Palpations: Abdomen is soft.  Musculoskeletal:     Cervical back: Neck supple.  Skin:    General: Skin is warm.     Findings: No rash.  Neurological:     Mental Status: She is alert and oriented to person, place, and time.  Psychiatric:        Behavior: Behavior normal.   The plan was reviewed with the patient/family, and all questions/concerned were addressed.  It was my pleasure to see Dawn [name] today and participate in her care. Please feel free to contact me with any questions or concerns.  Sincerely,  Rexene Alberts, DO Allergy & Immunology  [facility] office: [place] office: [phone]

## 2021-01-24 NOTE — Patient Instructions (Signed)
Patches placed today. Please avoid strenuous physical activities and do not get the patches on the back wet. No showering until final patch reading done. Okay to take antihistamines for itching but avoid placing any creams on the back where the patches are. We will remove the patches on Wednesday and will do our initial read. Then you will come back on Friday for a final read. 

## 2021-01-24 NOTE — Assessment & Plan Note (Signed)
History of breaking out in rashes as a child from jewelry. Now no issues except for earrings cause irritation. Scheduled for artificial disc replacement on the neck with possibly Nickel and zirconia containing products.   Discussed with patient that we don't have zirconia to patch test for.  The metal patch test does include nickel and other metals. . Metal patches placed today. . Please avoid strenuous physical activities and do not get the patches on the back wet. No showering until final patch reading done. Molli Knock to take antihistamines for itching but avoid placing any creams on the back where the patches are. . We will remove the patches on Wednesday and will do our initial read. . Then you will come back on Friday for a final read.

## 2021-01-26 ENCOUNTER — Encounter: Payer: Self-pay | Admitting: Allergy

## 2021-01-26 ENCOUNTER — Ambulatory Visit: Payer: 59 | Admitting: Allergy

## 2021-01-26 ENCOUNTER — Other Ambulatory Visit: Payer: Self-pay

## 2021-01-26 DIAGNOSIS — L230 Allergic contact dermatitis due to metals: Secondary | ICD-10-CM

## 2021-01-26 NOTE — Progress Notes (Signed)
    Follow-up Note  RE: Dawn [name] MRN: [id number] DOB: 23-Nov-1990 Date of Office Visit: 01/26/2021  Primary care provider: Pcp, No Referring provider: No ref. provider found   Dawn [name] returns to the office today for the initial patch test interpretation, given suspected history of contact dermatitis.    Diagnostics:  Metal series 48 hour reading: 1+ nickel sulfate hexahydrate  Plan:  Allergic contact dermatitis Read 1 positive to nickel which is not surprising to Dawn [name] She will return in 2 days for final reading  Dawn Aye, MD [facility]

## 2021-01-28 ENCOUNTER — Ambulatory Visit: Payer: 59 | Admitting: Family

## 2021-01-28 ENCOUNTER — Encounter: Payer: Self-pay | Admitting: Family

## 2021-01-28 ENCOUNTER — Other Ambulatory Visit: Payer: Self-pay

## 2021-01-28 VITALS — Temp 98.4°F

## 2021-01-28 DIAGNOSIS — L230 Allergic contact dermatitis due to metals: Secondary | ICD-10-CM

## 2021-01-28 NOTE — Patient Instructions (Signed)
Allergic contact dermatitis due to metals 96 hour patch test results were POSITIVE to : nickel sulfate hexahydrate 5% (1+)   NEGATIVE to: Chromium chloride 1%, cobalt chloride hexahydrate 1%, molybdenum chloride 0.5%, Tantal, potassium dichromate 0.25%, copper sulfate pentahydrate 2%, titanium 0.1%, magnese chloride 0.5%, aluminum hydroxide 10%, and vanadium pentoxide 10%  Schedule a follow-up appointment as needed.

## 2021-01-28 NOTE — Progress Notes (Signed)
Biannca returns to the office today for the final metal patch test interpretation, given suspected history of contact dermatitis.    Diagnostics:   Metal patch test 96-hour hour reading: 1+ to nickel sulfate hexahydrate 5%   NEGATIVE to: Chromium chloride 1%, cobalt chloride hexahydrate 1%, molybdenum chloride 0.5%, Tantal, potassium dichromate 0.25%, copper sulfate pentahydrate 2%, titanium 0.1%, magnese chloride 0.5%, aluminum hydroxide 10%, and vanadium pentoxide 10%   Plan:   Allergic contact dermatitis - The patient has been provided detailed information regarding the substances she is sensitive to, as well as products containing the substances.   - Meticulous avoidance of these substances is recommended.  - If avoidance is not possible, the use of barrier creams or lotions is recommended. - If symptoms persist or progress despite meticulous avoidance of nickel sulfate hexahydrate 5%, Dermatology Referral may be warranted.  Nehemiah Settle, FNP [facility].

## 2021-03-16 NOTE — Progress Notes (Signed)
30 y.o. G0P0 Single White or Caucasian Not Hispanic or Latino female here for annual exam.  Migraines are better with POP's.  Period Cycle (Days): 28 Period Duration (Days): 5-6 Period Pattern: Regular Menstrual Flow: Heavy Menstrual Control: Tampon Menstrual Control Change Freq (Hours): 1 Dysmenorrhea: (!) Mild Dysmenorrhea Symptoms: Cramping  FH of CHEK2 gene mutation and breast cancer. Patient was seen seen at the genetic center and is + for the CHEK2 mutation.  Genetics recommended: Annual mammogram at 52 and consider MRI at 21, referred to breast clinic. She will be seen there yearly until then.  Colonoscopy q 5 years starting at 60 (if no FH)  Patient's last menstrual period was 03/03/2021.          Sexually active: No.  The current method of family planning is OCP (estrogen/progesterone).    Exercising: No.  The patient does not participate in regular exercise at present. Smoker:  no  Health Maintenance: Pap:   12/06/18 [facility] HPV Neg; 09-03-15 neg    History of abnormal Pap:  no MMG:  none  BMD:   none  Colonoscopy: none TDaP:  09/12/16  Gardasil: no has been counseled, information given   reports that she has never smoked. She has never used smokeless tobacco. She reports current alcohol use of about 2.0 standard drinks per week. She reports that she does not use drugs. She has a glass of wine a night. 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   Past Medical History:  Diagnosis Date   Breast mass 07/25/2016   Pain with lump   Family history of breast cancer    Family history of colon cancer    Family history of esophageal cancer    Family history of gene mutation    Family history of melanoma    Fracture of left foot 2007   treated with brace   Hx of degenerative disc disease    Migraine    with aura   Monoallelic mutation of CHEK2 gene in female patient    Oligomenorrhea    better on OCP   Sjogren's syndrome ([place]) 2018   Tendonitis     Past  Surgical History:  Procedure Laterality Date   Cervical disk replacement  01/2021   fishbone removal     pt swallowed fishbone & had to be surgically removed   IRRIGATION AND DEBRIDEMENT SEBACEOUS CYST  08/17/2012   right shoulder area   WISDOM TOOTH EXTRACTION      Current Outpatient Medications  Medication Sig Dispense Refill   Ibuprofen (ADVIL PO) Take by mouth as needed.     L-Theanine 200 MG CAPS      MELATONIN PO Take by mouth.     norethindrone (MICRONOR) 0.35 MG tablet Take 1 tablet (0.35 mg total) by mouth daily. 84 tablet 3   No current facility-administered medications for this visit.    Family History  Problem Relation Age of Onset   Hyperlipidemia Mother    Hypertension Mother    Other Mother        CHEK2 gene mutation c.409C>T   Melanoma Father 30       scalp   Breast cancer Maternal Aunt 73       bilateral, CHEK2 gene mutation positive   Esophageal cancer Paternal Uncle        hx smoking & chew tobacco   Breast cancer Maternal Grandmother 59   Parkinson's disease Paternal Grandmother    Colon cancer Paternal Grandfather 57  age 58 in 2018   Alzheimer's disease Paternal Grandfather        dx 76s    Review of Systems  All other systems reviewed and are negative.  Exam:   BP 110/70   Pulse 86   Ht _0  (1.651 m)   Wt 138 lb (62.6 kg)   LMP 03/03/2021   SpO2 100%   BMI 22.96 kg/m   Weight change: _1 @ Height:   Height: _2  (165.1 cm)  Ht Readings from Last 3 Encounters:  03/17/21 _3  (1.651 m)  01/24/21 _4  (1.651 m)  03/11/20 _5  (1.651 m)    General appearance: alert, cooperative and appears stated age Head: Normocephalic, without obvious abnormality, atraumatic Neck: no adenopathy, supple, symmetrical, trachea midline and thyroid normal to inspection and palpation Lungs: clear to auscultation bilaterally Cardiovascular: regular rate and rhythm Breasts: normal appearance, no masses or tenderness Abdomen: soft,  non-tender; non distended,  no masses,  no organomegaly Extremities: extremities normal, atraumatic, no cyanosis or edema Skin: Skin color, texture, turgor normal. No rashes or lesions Lymph nodes: Cervical, supraclavicular, and axillary nodes normal. No abnormal inguinal nodes palpated Neurologic: Grossly normal   Pelvic: External genitalia:  no lesions              Urethra:  normal appearing urethra with no masses, tenderness or lesions              Bartholins and Skenes: normal                 Vagina: normal appearing vagina with normal color and discharge, no lesions              Cervix: no lesions               Bimanual Exam:  Uterus:  normal size, contour, position, consistency, mobility, non-tender              Adnexa: no mass, fullness, tenderness               Rectovaginal: declines  Gae Dry chaperoned for the exam.  1. Well woman exam Discussed breast self exam Discussed calcium and vit D intake No pap this year Information on gardasil given  2. Encounter for surveillance of contraceptive pills - norethindrone (MICRONOR) 0.35 MG tablet; Take 1 tablet (0.35 mg total) by mouth daily.  Dispense: 84 tablet; Refill: 3  4. Vitamin D deficiency She is no longer taking her supplement - VITAMIN D 25 Hydroxy (Vit-D Deficiency, Fractures)

## 2021-03-17 ENCOUNTER — Ambulatory Visit (INDEPENDENT_AMBULATORY_CARE_PROVIDER_SITE_OTHER): Payer: 59 | Admitting: Obstetrics and Gynecology

## 2021-03-17 ENCOUNTER — Encounter: Payer: Self-pay | Admitting: Obstetrics and Gynecology

## 2021-03-17 ENCOUNTER — Other Ambulatory Visit: Payer: Self-pay

## 2021-03-17 VITALS — BP 110/70 | HR 86 | Ht 65.0 in | Wt 138.0 lb

## 2021-03-17 DIAGNOSIS — Z3041 Encounter for surveillance of contraceptive pills: Secondary | ICD-10-CM | POA: Diagnosis not present

## 2021-03-17 DIAGNOSIS — Z01419 Encounter for gynecological examination (general) (routine) without abnormal findings: Secondary | ICD-10-CM | POA: Diagnosis not present

## 2021-03-17 DIAGNOSIS — E559 Vitamin D deficiency, unspecified: Secondary | ICD-10-CM | POA: Diagnosis not present

## 2021-03-17 DIAGNOSIS — Z0000 Encounter for general adult medical examination without abnormal findings: Secondary | ICD-10-CM | POA: Diagnosis not present

## 2021-03-17 MED ORDER — NORETHINDRONE 0.35 MG PO TABS: 1.0000 | ORAL_TABLET | Freq: Every day | ORAL | 3 refills | Status: AC

## 2021-03-17 NOTE — Patient Instructions (Signed)

## 2021-03-18 LAB — COMPREHENSIVE METABOLIC PANEL
AG Ratio: 1.8 (calc) (ref 1.0–2.5)
ALT: 18 U/L (ref 6–29)
AST: 22 U/L (ref 10–30)
Albumin: 4.8 g/dL (ref 3.6–5.1)
Alkaline phosphatase (APISO): 55 U/L (ref 31–125)
BUN: 9 mg/dL (ref 7–25)
CO2: 25 mmol/L (ref 20–32)
Calcium: 9.6 mg/dL (ref 8.6–10.2)
Chloride: 105 mmol/L (ref 98–110)
Creat: 0.52 mg/dL (ref 0.50–0.97)
Globulin: 2.7 g/dL (calc) (ref 1.9–3.7)
Glucose, Bld: 80 mg/dL (ref 65–99)
Potassium: 4.2 mmol/L (ref 3.5–5.3)
Sodium: 138 mmol/L (ref 135–146)
Total Bilirubin: 0.7 mg/dL (ref 0.2–1.2)
Total Protein: 7.5 g/dL (ref 6.1–8.1)

## 2021-03-18 LAB — CBC
HCT: 41.9 % (ref 35.0–45.0)
Hemoglobin: 14.1 g/dL (ref 11.7–15.5)
MCH: 30.3 pg (ref 27.0–33.0)
MCHC: 33.7 g/dL (ref 32.0–36.0)
MCV: 89.9 fL (ref 80.0–100.0)
MPV: 11.8 fL (ref 7.5–12.5)
Platelets: 257 10*3/uL (ref 140–400)
RBC: 4.66 10*6/uL (ref 3.80–5.10)
RDW: 12 % (ref 11.0–15.0)
WBC: 5.7 10*3/uL (ref 3.8–10.8)

## 2021-03-18 LAB — VITAMIN D 25 HYDROXY (VIT D DEFICIENCY, FRACTURES): Vit D, 25-Hydroxy: 25 ng/mL — ABNORMAL LOW (ref 30–100)

## 2021-03-18 LAB — LIPID PANEL
Cholesterol: 157 mg/dL (ref <200)
HDL: 58 mg/dL (ref >=50)
LDL Cholesterol (Calc): 80 mg/dL (calc)
Non-HDL Cholesterol (Calc): 99 mg/dL (calc) (ref <130)
Total CHOL/HDL Ratio: 2.7 (calc) (ref <5.0)
Triglycerides: 105 mg/dL (ref <150)

## 2021-10-25 ENCOUNTER — Telehealth: Payer: Self-pay

## 2021-10-25 NOTE — Telephone Encounter (Signed)
Notes scanned to referral 

## 2022-01-12 DIAGNOSIS — R002 Palpitations: Secondary | ICD-10-CM | POA: Insufficient documentation

## 2022-01-16 ENCOUNTER — Ambulatory Visit: Payer: 59 | Admitting: Internal Medicine

## 2022-01-16 ENCOUNTER — Encounter: Payer: Self-pay | Admitting: Internal Medicine

## 2022-01-16 ENCOUNTER — Ambulatory Visit (INDEPENDENT_AMBULATORY_CARE_PROVIDER_SITE_OTHER): Payer: 59

## 2022-01-16 VITALS — HR 85 | Ht 65.0 in | Wt 130.0 lb

## 2022-01-16 DIAGNOSIS — R6889 Other general symptoms and signs: Secondary | ICD-10-CM

## 2022-01-16 DIAGNOSIS — R002 Palpitations: Secondary | ICD-10-CM

## 2022-01-16 DIAGNOSIS — Q796 Ehlers-Danlos syndrome, unspecified: Secondary | ICD-10-CM

## 2022-01-16 NOTE — Progress Notes (Unsigned)
Enrolled for [employer] to mail a ZIO XT long term holter monitor to the patients address on file.   Dr. Klein to read. 

## 2022-01-16 NOTE — Patient Instructions (Signed)
Medication Instructions:  Your physician recommends that you continue on your current medications as directed. Please refer to the Current Medication list given to you today.  *If you need a refill on your cardiac medications before your next appointment, please call your pharmacy*   Lab Work: None ordered.  If you have labs (blood work) drawn today and your tests are completely normal, you will receive your results only by: MyChart Message (if you have MyChart) OR A paper copy in the mail If you have any lab test that is abnormal or we need to change your treatment, we will call you to review the results.   Testing/Procedures:  Your physician has requested that you have an echocardiogram. Echocardiography is a painless test that uses sound waves to create images of your heart. It provides your doctor with information about the size and shape of your heart and how well your heart's chambers and valves are working. This procedure takes approximately one hour. There are no restrictions for this procedure.  ZIO XT- Long Term Monitor Instructions  Your physician has requested you wear a ZIO patch monitor for 14 days.  This is a single patch monitor. [employer] supplies one patch monitor per enrollment. Additional stickers are not available. Please do not apply patch if you will be having a Nuclear Stress Test,  Echocardiogram, Cardiac CT, MRI, or Chest Xray during the period you would be wearing the  monitor. The patch cannot be worn during these tests. You cannot remove and re-apply the  ZIO XT patch monitor.  Your ZIO patch monitor will be mailed 3 day USPS to your address on file. It may take 3-5 days  to receive your monitor after you have been enrolled.  Once you have received your monitor, please review the enclosed instructions. Your monitor  has already been registered assigning a specific monitor serial # to you.  Billing and Patient Assistance Program Information  We have  supplied [employer] with any of your insurance information on file for billing purposes. [employer] offers a sliding scale Patient Assistance Program for patients that do not have  insurance, or whose insurance does not completely cover the cost of the ZIO monitor.  You must apply for the Patient Assistance Program to qualify for this discounted rate.  To apply, please call [employer] at [phone], select option 4, select option 2, ask to apply for  Patient Assistance Program. Meredeth Ide will ask your household income, and how many people  are in your household. They will quote your out-of-pocket cost based on that information.  [employer] will also be able to set up a 45-month, interest-free payment plan if needed.  Applying the monitor   Shave hair from upper left chest.  Hold abrader disc by orange tab. Rub abrader in 40 strokes over the upper left chest as  indicated in your monitor instructions.  Clean area with 4 enclosed alcohol pads. Let dry.  Apply patch as indicated in monitor instructions. Patch will be placed under collarbone on left  side of chest with arrow pointing upward.  Rub patch adhesive wings for 2 minutes. Remove white label marked "1". Remove the white  label marked "2". Rub patch adhesive wings for 2 additional minutes.  While looking in a mirror, press and release button in center of patch. A small green light will  flash 3-4 times. This will be your only indicator that the monitor has been turned on.  Do not shower for the first 24 hours. You may shower  after the first 24 hours.  Press the button if you feel a symptom. You will hear a small click. Record Date, Time and  Symptom in the Patient Logbook.  When you are ready to remove the patch, follow instructions on the last 2 pages of Patient  Logbook. Stick patch monitor onto the last page of Patient Logbook.  Place Patient Logbook in the blue and white box. Use locking tab on box and tape box closed  securely. The blue and  white box has prepaid postage on it. Please place it in the mailbox as  soon as possible. Your physician should have your test results approximately 7 days after the  monitor has been mailed back to [facility].  Call [facility] Customer Care at [phone] if you have questions regarding  your ZIO XT patch monitor. Call them immediately if you see an orange light blinking on your  monitor.  If your monitor falls off in less than 4 days, contact our Monitor department at [phone].  If your monitor becomes loose or falls off after 4 days call [employer] at [phone] for  suggestions on securing your monitor    Follow-Up: At [facility], you and your health needs are our priority.  As part of our continuing mission to provide you with exceptional heart care, we have created designated Provider Care Teams.  These Care Teams include your primary Cardiologist (physician) and Advanced Practice Providers (APPs -  Physician Assistants and Nurse Practitioners) who all work together to provide you with the care you need, when you need it.  We recommend signing up for the patient portal called "MyChart".  Sign up information is provided on this After Visit Summary.  MyChart is used to connect with patients for Virtual Visits (Telemedicine).  Patients are able to view lab/test results, encounter notes, upcoming appointments, etc.  Non-urgent messages can be sent to your provider as well.   To learn more about what you can do with MyChart, go to [email or web address].    Your next appointment:    03/15/2022 at 2pm   Other Instructions Discussed salt substitutes with a  goal of 2-3 gms of Sodium or 5-7 gm of sodium Chloride daily.  Rehydration solutions include Liquid IV, NUUN, TriOral, Normralyte pedialyte advanced care and Banana Bags.  Salt tablets include plain salt tablets, SaltStick Vitassium, thermatabs amongst others.   The higher sodium concentration per serving on this list is  normalyte about 800 mg of sodium per serving LMNT1000 mg and most recently of worried about Within You hydration formula also at 1000 mg  Salt supplements are best used with adjunctive sugar  Also discussed the role of compression wear including thigh sleeves and abdominal binders.  Calf compression is not specifically recommended..   Important Information About Sugar

## 2022-01-16 NOTE — Progress Notes (Signed)
ELECTROPHYSIOLOGY CONSULT NOTE  Patient ID: Dawn [name], MRN: [id number], DOB/AGE: 33/28/1992 33 y.o. Admit date: (Not on file) Date of Consult: 01/16/2022  Primary Physician: Pcp, No Primary Cardiologist: new     Dawn [name] is a 31 y.o. female who is being seen today for the evaluation of orthostatic LH palpitations at the request of Dr Terri Piedra.    HPI Dawn [name] is a 31 y.o. female referred for orthostatic lightheadedness.  These can be associated with presyncope and tunnel vision.  They are not aggravated around the time of her menses or with showers.  Has some heat intolerance.  Diet is salt deplete but fluid replete.   Blood pressures at home have been recorded in the 90-110 range.  She used to be exceedingly fit, a Engineer, mining.  She is no longer as fit going to the gym but finding her ability to exercise a improve her conditioning limited.  Significant exercise tolerance with palpitations and dyspnea.  She also has lifelong hyperflexibility.  Noted in her hands, joints, there have been multiple injuries with exercise.  She also has very lax skin.  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   Also diagnosed with Sjogren syndrome mostly manifest with dry eyes dry mouth  Date Cr K Hgb  9/22 0.52 4.2 14.1            Past Medical History:  Diagnosis Date   Breast mass 07/25/2016   Pain with lump   Family history of breast cancer    Family history of colon cancer    Family history of esophageal cancer    Family history of gene mutation    Family history of melanoma    Fracture of left foot 2007   treated with brace   Hx of degenerative disc disease    Migraine    with aura   Monoallelic mutation of CHEK2 gene in female patient    Oligomenorrhea    better on OCP   Sjogren's syndrome ([place]) 2018   Tendonitis       Surgical History:  Past Surgical History:  Procedure Laterality Date   Cervical disk replacement  01/2021   fishbone removal      pt swallowed fishbone & had to be surgically removed   IRRIGATION AND DEBRIDEMENT SEBACEOUS CYST  08/17/2012   right shoulder area   WISDOM TOOTH EXTRACTION       Home Meds: Current Meds  Medication Sig   Ibuprofen (ADVIL PO) Take by mouth as needed.   L-Theanine 200 MG CAPS Take 200 mg by mouth daily as needed.   MELATONIN PO Take by mouth.   norethindrone (MICRONOR) 0.35 MG tablet Take 1 tablet (0.35 mg total) by mouth daily.    Allergies:  Allergies  Allergen Reactions   Amoxicillin    Ceclor [Cefaclor]    Penicillins    Prednisone Diarrhea and Nausea And Vomiting    Social History   Socioeconomic History   Marital status: Single    Spouse name: Not on file   Number of children: 0   Years of education: Not on file   Highest education level: Not on file  Occupational History   Not on file  Tobacco Use   Smoking status: Never   Smokeless tobacco: Never  Substance and Sexual Activity   Alcohol use: Yes    Alcohol/week: 2.0 standard drinks of alcohol    Types: 2 Glasses of wine per week   Drug use:  No   Sexual activity: Not Currently    Partners: Male    Birth control/protection: OCP  Other Topics Concern   Not on file  Social History Narrative   Not on file   Social Determinants of Health   Financial Resource Strain: Not on file  Food Insecurity: Not on file  Transportation Needs: Not on file  Physical Activity: Not on file  Stress: Not on file  Social Connections: Not on file  Intimate Partner Violence: Not on file     Family History  Problem Relation Age of Onset   Hyperlipidemia Mother    Hypertension Mother    Other Mother        CHEK2 gene mutation c.409C>T   Melanoma Father 41       scalp   Breast cancer Maternal Aunt 73       bilateral, CHEK2 gene mutation positive   Esophageal cancer Paternal Uncle        hx smoking & chew tobacco   Breast cancer Maternal Grandmother 39   Parkinson's disease Paternal Grandmother    Colon cancer  Paternal Grandfather 67       age 36 in 2018   Alzheimer's disease Paternal Grandfather        dx 49s     ROS:  Please see the history of present illness.     All other systems reviewed and negative.    Physical Exam: Pulse 85, height $RemoveBe'5\' 5"'pmtNbGAjA$  (1.651 m), weight 130 lb (59 kg). General: Well developed, well nourished female in no acute distress. Head: Normocephalic, atraumatic, sclera non-icteric, no xanthomas, nares are without discharge. EENT: normal  Lymph Nodes:  none Neck: Negative for carotid bruits. JVD not elevated. Back:without scoliosis kyphosis Lungs: Clear bilaterally to auscultation without wheezes, rales, or rhonchi. Breathing is unlabored. Heart: RRR with S1 S2. No  murmur . No rubs, or gallops appreciated. Abdomen: Soft, non-tender, non-distended with normoactive bowel sounds. No hepatomegaly. No rebound/guarding. No obvious abdominal masses. Msk:  Strength and tone appear normal for age. Extremities: No clubbing or cyanosis. No edema.  Distal pedal pulses are 2+ and equal bilaterally. Joint laxity  Skin: Warm and Dry  skin laxity Neuro: Alert and oriented X 3. CN III-XII intact Grossly normal sensory and motor function . Psych:  Responds to questions appropriately with a normal affect.        EKG: sinus @ 85 15/08/36   Assessment and Plan:  Orthostatic intolerance with orthostatic hypertension evident today  Exercise intolerance with tachycardia  Hypotension at home blood pressure 90s-100s  Arachnodactyly  Joint laxity  Sjogrens    Patient has orthostatic symptoms even today without evidence of hypotension or tachycardia.  Blood pressures at home are considerably lower and I wonder if there is a component of whitecoat hypertension which is masking orthostatic hypotension.  Common things being common we will presume that initially although orthostatic hypertension does occur. We discussed the physiology of orthostatic intolerance including gravitational  fluid shifts and the impact of hypertensive vascular disease on orthostasis and treatment options.     We discussed nonpharmacological options including  isometric contraction upon standing, abdominal binders  thigh sleeves.  Calf compression is not specifically recommended..  Discussed salt substitutes with a  goal of 2-3 gms of Sodium or 5-7 gm of sodium Chloride daily.  Rehydration solutions include Liquid IV, NUUN, TriOral, Normralyte pedialyte advanced care and Banana Bags.  Salt tablets include plain salt tablets, SaltStick Vitassium, thermatabs amongst others.   The higher  sodium concentration per serving on this list is normalyte about 800 mg of sodium per serving LMNT1000 mg and most recently of worried about Within You hydration formula also at 1000 mg  Salt supplements are best used with adjunctive sugar   These occur in the context of arachnodactyly joint laxity, probably in EDS/JHS variant.  I have reached out to physical therapist to see if we can find someone who could help her exercise more safely.  She needs an echocardiogram to assess her mitral valve in the context of this.  She does not use alcohol significantly or marijuana at all         Virl Axe

## 2022-01-18 ENCOUNTER — Encounter: Payer: Self-pay | Admitting: Hematology and Oncology

## 2022-01-18 ENCOUNTER — Inpatient Hospital Stay: Payer: 59 | Attending: Hematology and Oncology | Admitting: Hematology and Oncology

## 2022-01-18 ENCOUNTER — Other Ambulatory Visit: Payer: Self-pay

## 2022-01-18 VITALS — BP 124/92 | HR 74 | Temp 97.9°F | Resp 18 | Ht 65.0 in | Wt 130.5 lb

## 2022-01-18 DIAGNOSIS — R234 Changes in skin texture: Secondary | ICD-10-CM | POA: Diagnosis not present

## 2022-01-18 DIAGNOSIS — Z800 Family history of malignant neoplasm of digestive organs: Secondary | ICD-10-CM | POA: Diagnosis not present

## 2022-01-18 DIAGNOSIS — Z803 Family history of malignant neoplasm of breast: Secondary | ICD-10-CM | POA: Diagnosis not present

## 2022-01-18 DIAGNOSIS — Z1501 Genetic susceptibility to malignant neoplasm of breast: Secondary | ICD-10-CM | POA: Insufficient documentation

## 2022-01-18 DIAGNOSIS — M3500 Sicca syndrome, unspecified: Secondary | ICD-10-CM | POA: Insufficient documentation

## 2022-01-18 DIAGNOSIS — N6012 Diffuse cystic mastopathy of left breast: Secondary | ICD-10-CM | POA: Insufficient documentation

## 2022-01-18 DIAGNOSIS — Z1589 Genetic susceptibility to other disease: Secondary | ICD-10-CM

## 2022-01-18 DIAGNOSIS — Z1509 Genetic susceptibility to other malignant neoplasm: Secondary | ICD-10-CM | POA: Diagnosis not present

## 2022-01-18 DIAGNOSIS — Z793 Long term (current) use of hormonal contraceptives: Secondary | ICD-10-CM | POA: Diagnosis not present

## 2022-01-18 DIAGNOSIS — Z1502 Genetic susceptibility to malignant neoplasm of ovary: Secondary | ICD-10-CM

## 2022-01-18 NOTE — Progress Notes (Signed)
[facility] CONSULT NOTE  Patient Care Team: Pcp, No as PCP - General  CHIEF COMPLAINTS/PURPOSE OF CONSULTATION:   CHEK 2 mutation.  ASSESSMENT & PLAN:   This is a very pleasant 30-year-old female patient with a pathogenic heterozygous CHEK2 mutation and a family history of breast cancer referred to high-risk breast clinic for further recommendations.  We have discussed increased risk of breast cancer which can range from about 25 to 45% in patients with CHEK2 pathogenic mutation depending upon the family history of breast cancer.  We have also discussed about increased risk of colon cancer and need to proceed with the colonoscopy screening every 5 years starting at the age of 40.   In the past, we have discussed MRI alternating with mammogram for breast cancer screening starting age of 40 She is here for an annual exam.  She continues to have cystic breasts with no significant change.  Bilateral nipple crusting noted mild.  She does have family history of breast cancer in maternal grandmother and paternal aunt, age of onset in early 70s. She was encouraged to continue self breast exams and report any changes to us.   She will try to alternate breast exams with her OB and return to clinic for follow-up with us in 1 year.  HISTORY OF PRESENTING ILLNESS:  Dawn [name] 31 y.o. female is here because of CHEK 2 mutation  This is a pleasant 30-year-old young healthy female patient with Sjogren's syndrome and family history significant for breast cancer with recent genetic testing with results showing CHEK2 heterozygous pathogenic mutation and hence referred to high risk breast clinic.  She has a grandmother as well as a maternal aunt who had bilateral breast cancer and that grandfather on pateranl side had colon cancer in his 70s.  She is nulliparous, denies any hormone replacement therapy, currently uses progesterone only contraception to regulate menstrual cycles.  Since last visit,  she has regular menstrual cycles on birth control.  She has some mastalgia during her menstruation but some change in the cystic areas but this resolves after completion of menstrual cycle.  She otherwise is exercising regularly and denies any new health complaints Rest of the pertinent 10 point ROS reviewed and negative.  REVIEW OF SYSTEMS:   Constitutional: Denies fevers, chills or abnormal night sweats Eyes: Denies blurriness of vision, double vision or watery eyes Ears, nose, mouth, throat, and face: Denies mucositis or sore throat Respiratory: Denies cough, dyspnea or wheezes Cardiovascular: Denies palpitation, chest discomfort or lower extremity swelling Gastrointestinal:  Denies nausea, heartburn or change in bowel habits Skin: Denies abnormal skin rashes Lymphatics: Denies new lymphadenopathy or easy bruising Neurological:Denies numbness, tingling or new weaknesses Behavioral/Psych: Mood is stable, no new changes  All other systems were reviewed with the patient and are negative.  MEDICAL HISTORY:  Past Medical History:  Diagnosis Date   Breast mass 07/25/2016   Pain with lump   Family history of breast cancer    Family history of colon cancer    Family history of esophageal cancer    Family history of gene mutation    Family history of melanoma    Fracture of left foot 2007   treated with brace   Hx of degenerative disc disease    Migraine    with aura   Monoallelic mutation of CHEK2 gene in female patient    Oligomenorrhea    better on OCP   Sjogren's syndrome ([facility]) 2018   Tendonitis       SURGICAL HISTORY: Past Surgical History:  Procedure Laterality Date   Cervical disk replacement  01/2021   fishbone removal     pt swallowed fishbone & had to be surgically removed   IRRIGATION AND DEBRIDEMENT SEBACEOUS CYST  08/17/2012   right shoulder area   WISDOM TOOTH EXTRACTION      SOCIAL HISTORY: Social History   Socioeconomic History   Marital status: Single     Spouse name: Not on file   Number of children: 0   Years of education: Not on file   Highest education level: Not on file  Occupational History   Not on file  Tobacco Use   Smoking status: Never   Smokeless tobacco: Never  Substance and Sexual Activity   Alcohol use: Yes    Alcohol/week: 2.0 standard drinks of alcohol    Types: 2 Glasses of wine per week   Drug use: No   Sexual activity: Not Currently    Partners: Male    Birth control/protection: OCP  Other Topics Concern   Not on file  Social History Narrative   Not on file   Social Determinants of Health   Financial Resource Strain: Not on file  Food Insecurity: Not on file  Transportation Needs: Not on file  Physical Activity: Not on file  Stress: Not on file  Social Connections: Not on file  Intimate Partner Violence: Not on file    FAMILY HISTORY: Family History  Problem Relation Age of Onset   Hyperlipidemia Mother    Hypertension Mother    Other Mother        CHEK2 gene mutation c.409C>T   Melanoma Father 57       scalp   Breast cancer Maternal Aunt 73       bilateral, CHEK2 gene mutation positive   Esophageal cancer Paternal Uncle        hx smoking & chew tobacco   Breast cancer Maternal Grandmother 86   Parkinson's disease Paternal Grandmother    Colon cancer Paternal Grandfather 24       age 34 in 2018   Alzheimer's disease Paternal Grandfather        dx 60s    ALLERGIES:  is allergic to amoxicillin, ceclor [cefaclor], penicillins, and prednisone.  MEDICATIONS:  Current Outpatient Medications  Medication Sig Dispense Refill   Ibuprofen (ADVIL PO) Take by mouth as needed.     L-Theanine 200 MG CAPS Take 200 mg by mouth daily as needed.     MELATONIN PO Take by mouth.     norethindrone (MICRONOR) 0.35 MG tablet Take 1 tablet (0.35 mg total) by mouth daily. 84 tablet 3   No current facility-administered medications for this visit.    PHYSICAL EXAMINATION: ECOG PERFORMANCE STATUS: 0 -  Asymptomatic  Vitals:   01/18/22 0945  BP: (!) 124/92  Pulse: 74  Resp: 18  Temp: 97.9 F (36.6 C)  SpO2: 100%   Filed Weights   01/18/22 0945  Weight: 130 lb 8 oz (59.2 kg)    GENERAL:alert, no distress and comfortable Breast exam.  Bilateral breasts inspected and palpated.  Cystic areas noted in bilateral breast.  Largest cyst is in the left breast upper outer quadrant.  Bilateral nipple crusting noted with no discharge.  No firm masses or regional adenopathy.  LABORATORY DATA:  I have reviewed the data as listed Lab Results  Component Value Date   WBC 5.7 03/17/2021   HGB 14.1 03/17/2021   HCT 41.9 03/17/2021   MCV  89.9 03/17/2021   PLT 257 03/17/2021     Chemistry      Component Value Date/Time   NA 138 03/17/2021 0916   NA 139 03/15/2020 0843   K 4.2 03/17/2021 0916   CL 105 03/17/2021 0916   CO2 25 03/17/2021 0916   BUN 9 03/17/2021 0916   BUN 12 03/15/2020 0843   CREATININE 0.52 03/17/2021 0916      Component Value Date/Time   CALCIUM 9.6 03/17/2021 0916   ALKPHOS 50 03/15/2020 0843   AST 22 03/17/2021 0916   ALT 18 03/17/2021 0916   BILITOT 0.7 03/17/2021 0916   BILITOT 0.7 03/15/2020 0843       RADIOGRAPHIC STUDIES: I have personally reviewed the radiological images as listed and agreed with the findings in the report. No results found.  All questions were answered. The patient knows to call the clinic with any problems, questions or concerns. I spent 20 minutes in the care of this patient including H and P, review of records, counseling and coordination of care. Please review my above-mentioned assessment and plan for detailed discussion.    Benay Pike, MD 01/18/2022 9:50 AM

## 2022-01-24 ENCOUNTER — Ambulatory Visit: Payer: 59 | Attending: Internal Medicine | Admitting: Physical Therapy

## 2022-01-24 ENCOUNTER — Encounter: Payer: Self-pay | Admitting: Physical Therapy

## 2022-01-24 ENCOUNTER — Institutional Professional Consult (permissible substitution): Payer: 59 | Admitting: Internal Medicine

## 2022-01-24 VITALS — BP 163/94 | HR 109

## 2022-01-24 DIAGNOSIS — R6889 Other general symptoms and signs: Secondary | ICD-10-CM | POA: Insufficient documentation

## 2022-01-24 DIAGNOSIS — M357 Hypermobility syndrome: Secondary | ICD-10-CM | POA: Diagnosis present

## 2022-01-24 DIAGNOSIS — M2550 Pain in unspecified joint: Secondary | ICD-10-CM | POA: Insufficient documentation

## 2022-01-24 DIAGNOSIS — R002 Palpitations: Secondary | ICD-10-CM | POA: Diagnosis not present

## 2022-01-24 DIAGNOSIS — Q796 Ehlers-Danlos syndrome, unspecified: Secondary | ICD-10-CM | POA: Insufficient documentation

## 2022-01-24 NOTE — Therapy (Signed)
OUTPATIENT PHYSICAL THERAPY THORACOLUMBAR EVALUATION   Patient Name: Dawn [name] MRN: [id number] DOB:01/17/1991, 31 y.o., female Today's Date: 01/24/2022   PT End of Session - 01/24/22 0856     Visit Number 1    Number of Visits 12    Date for PT Re-Evaluation 03/07/22    PT Start Time 0845    PT Stop Time 0930    PT Time Calculation (min) 45 min    Activity Tolerance Patient tolerated treatment well    Behavior During Therapy [facility] for tasks assessed/performed             Past Medical History:  Diagnosis Date   Breast mass 07/25/2016   Pain with lump   Family history of breast cancer    Family history of colon cancer    Family history of esophageal cancer    Family history of gene mutation    Family history of melanoma    Fracture of left foot 2007   treated with brace   Hx of degenerative disc disease    Migraine    with aura   Monoallelic mutation of CHEK2 gene in female patient    Oligomenorrhea    better on OCP   Sjogren's syndrome ([place]) 2018   Tendonitis    Past Surgical History:  Procedure Laterality Date   Cervical disk replacement  01/2021   fishbone removal     pt swallowed fishbone & had to be surgically removed   [place] CYST  08/17/2012   right shoulder area   [place] EXTRACTION     Patient Active Problem List   Diagnosis Date Noted   Palpitations 01/12/2022   Allergic contact dermatitis 92/76/3943   Monoallelic mutation of CHEK2 gene in female patient 12/30/2020   Genetic testing 12/29/2020   Family history of gene mutation 12/14/2020   Family history of breast cancer 12/14/2020   Family history of melanoma 12/14/2020   Family history of colon cancer 12/14/2020   Family history of esophageal cancer 12/14/2020   Recurrent epistaxis 07/27/2020   Degenerative disc disease, cervical 12/06/2018   Dysphagia, idiopathic 07/13/2017   Gastroesophageal reflux disease without esophagitis 07/13/2017   History of  Sjogren's disease ([place]) 07/13/2017    PCP:  REFERRING PROVIDER: Virl Axe, MD   REFERRING DIAG: R00.2 (ICD-10-CM) - Palpitations R68.89 (ICD-10-CM) - Exercise intolerance Q79.60 (ICD-10-CM) - Ehlers-Danlos syndrome   Rationale for Evaluation and Treatment Rehabilitation  THERAPY DIAG:  Hypermobility syndrome  Polyarthralgia  Exercise intolerance  ONSET DATE: chronic     SUBJECTIVE:  SUBJECTIVE STATEMENT:  Patient referred to PT from cardiologist.  He wants me to see how to continue working out without injuring myself.  Pt with dizziness relates to position changes, fatigue and overall limited tolerance for activity, exercise.  She reports impaired proprioception.  "I injure myself about every few weeks" at the gym.  She reports global joint instability and random joint pain in multiple joints.  She has not bee diagnosed with Drue Dun but suspects she may have it.    PER DR.  KLEIN:  Symptoms can be associated with presyncope and tunnel vision.  They are not aggravated around the time of her menses or with showers.  Has some heat intolerance.  Diet is salt deplete but fluid replete.    Blood pressures at home have been recorded in the 90-110 range.   She used to be exceedingly fit, multiple modes of exercise.   She is no longer as fit going to the gym but finding her ability to exercise a improve her conditioning limited.  Significant exercise tolerance with palpitations and dyspnea.   She also has lifelong hyperflexibility.  Noted in her hands, joints, there have been multiple injuries with exercise.  She also has very lax skin.  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   Also diagnosed with Sjogren syndrome mostly manifest with dry eyes dry mouth   PERTINENT HISTORY:   PT for knee L, hips, achilles, neck/shoulder, wrist pain (had OT) Father with hypermobility BRCA gene Cervical fusion  Sjogren syndrome   PAIN:  Are you having pain? Yes: NPRS scale: mild /10 Pain location: foot, pinky  Pain description: depends on the joint  Aggravating factors: varies, standing still   Relieving factors: taping, braces for multiple joints She has frequent pain in neck, shoulders, knees and lower back.   PRECAUTIONS: Other: frequent sprain, subluxations of shoulder, rolled ankle, patella  WEIGHT BEARING RESTRICTIONS No  FALLS:  Has patient fallen in last 6 months? 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    PLOF: Independent  PATIENT GOALS To get stronger and figure out how to build muscle . Works out 4:30 am 3+ days per week.    OBJECTIVE:   DIAGNOSTIC FINDINGS:  2020:  Normal MRI appearance of the lumbar spine.  PATIENT SURVEYS:  FOTO 60% [place]  scale given to patient for home  Beighton Scale Lumbar (_0/1) Knees (_0/2) Elbows (2_/2)  5th digit (_0/2) Thumb (_0/2) 2/9 Comment on hips, shoulders: Shoulders very flexible, can reach behind back and clasp hands in the middle Rt hip subluxes volitionally   COGNITION:  Overall cognitive status: Within functional limits for tasks assessed     SENSATION: Reports numbness and tingling in both feet intermittently   MUSCLE LENGTH: Hamstrings: Right 40 deg; Left 42 deg Thomas test: Right NT deg; Left NT deg  POSTURE: No Significant postural limitations  PALPATION: TTP lateral Rt hip, NT other than that  Mild hypermobility noted in thoracolumbar spine, no pain   LUMBAR ROM:   Active  A/PROM  eval  Flexion 4 inches from floor  Extension WNL/hyper   Right lateral flexion WNL    Left lateral flexion WNL   Right rotation WNL  Left rotation WNL  Patient moves quickly and with ease   LOWER  EXTREMITY ROM:     Passive   Right eval Left eval  Hip flexion WNL WNL  Hip extension WNL WNL  Hip abduction    Hip adduction    Hip internal rotation WNL, 50+ deg WNL 50+  Hip external rotation WNL 45-50 WNL 45-50  Knee flexion WNL  WNL  Knee extension 0 0  Ankle dorsiflexion    Ankle plantarflexion    Ankle inversion    Ankle eversion     (Blank rows = not tested)  LOWER EXTREMITY MMT:    MMT Right eval Left eval  Hip flexion 5 5  Hip extension 5 5  Hip abduction 4 4+  Hip adduction    Hip internal rotation    Hip external rotation    Knee flexion 5 5  Knee extension 5 5  Ankle dorsiflexion    Ankle plantarflexion    Ankle inversion    Ankle eversion     (Blank rows = not tested)    UPPER EXTREMITY ROM:   WFL throughout UEs  Active ROM Right 01/24/2022 Left 01/24/2022  Shoulder flexion    Shoulder extension    Shoulder abduction    Shoulder adduction    Shoulder internal rotation    Shoulder external rotation    Elbow flexion    Elbow extension    Wrist flexion    Wrist extension    Wrist ulnar deviation    Wrist radial deviation    Wrist pronation    Wrist supination    (Blank rows = not tested)  UPPER EXTREMITY MMT:  MMT Right 01/24/2022 Left 01/24/2022  Shoulder flexion 4+ 4+  Shoulder extension    Shoulder abduction 4+ 4+  Shoulder adduction    Shoulder internal rotation    Shoulder external rotation    Middle trapezius 4- 4-  Lower trapezius    Elbow flexion 5 5  Elbow extension 4 4  Wrist flexion    Wrist extension    Wrist ulnar deviation    Wrist radial deviation    Wrist pronation    Wrist supination    Grip strength (lbs)    (Blank rows = not tested)  JOINT MOBILITY TESTING:  NT  PALPATION:  NT  FUNCTIONAL TESTS:  2 minute walk test: 684 feet  GAIT: Distance walked: 684 Assistive device utilized:  None Level of assistance: Complete Independence Comments: face pace, normal stride    TODAY'S TREATMENT  PT eval, including assessment of overall strength, joint mobility. 2 min walk. Establish POC>    PATIENT EDUCATION:  Education details: POC, joint mobility, neutral spine  Person educated: Patient Education method: Customer service manager Education comprehension: verbalized understanding   HOME EXERCISE PROGRAM: None yet   ASSESSMENT:  CLINICAL IMPRESSION: Patient is a 31 y.o. female who was seen today for physical therapy evaluation and treatment for general hypermobility and associated symptoms of dysautonomia. Overall, Dawn [name] is well appearing and in no distress.  She accepts that she has a varied pain presentation and knows she lacks strength, endurance and decreased resilience with functional daily activities.  Her posture is sub-optimal but can support and demonstrate good posture when cued.  She would like to be more aware of her body position and be able to increase her muscle strength as she ages to avoid/minimize injury. Overall her LEs are not hypermobile but does show hypermobility in spine, shoulders and wrist, elbow. She will be further tested next visit for cardio recovery (step test) and challenged progressively as  able while monitoring vitals and RPE   OBJECTIVE IMPAIRMENTS cardiopulmonary status limiting activity, difficulty walking, decreased strength, dizziness, increased fascial restrictions, pain, and joint hypermobility  .   ACTIVITY LIMITATIONS carrying, lifting, bending, sitting, standing, squatting, and locomotion level  PARTICIPATION LIMITATIONS: community activity, occupation, and fitness  PERSONAL FACTORS Time since onset of injury/illness/exacerbation and 1-2 comorbidities: multi-system , multijoint involvement  are also affecting patient's functional outcome.   REHAB POTENTIAL: Excellent  CLINICAL DECISION MAKING: Evolving/moderate  complexity  EVALUATION COMPLEXITY: Moderate   GOALS: Goals reviewed with patient? No  SHORT TERM GOALS: Target date: 02/14/2022  Pt will be I with HEP for spinal stabilization, initial  Baseline: Goal status: INITIAL  2.  Pt will be able to show good body mechanics for lifting, hinging no wgt Baseline:  Goal status: INITIAL  3.  Pt will have baseline measures for cardiorespiratory endurance and goal set  Baseline:  Goal status: INITIAL   LONG TERM GOALS: Target date: 03/07/2022  Pt will be able to report consistent HEP at home with no increased in pain Baseline:  Goal status: INITIAL  2.  Pt will be able to demo core good strength and stability in neutral spine with intermediate level exercises Baseline:  Goal status: INITIAL  3.  Pt will be able to perform lifting in gym/home with good control and knowledge of joint position, no increase in joint pain, including squats  Baseline:  Goal status: INITIAL  4.  Cardio goals TBA Baseline:  Goal status: INITIAL  5. Pt will be able to report mild increase in fatigue following extended periods of standing , walking in the community (> 2 hours) Baseline:  Goal status: INITIAL   PLAN: PT FREQUENCY: 1-2x/week  PT DURATION: 6 weeks  PLANNED INTERVENTIONS: Therapeutic exercises, Therapeutic activity, Neuromuscular re-education, Balance training, Gait training, Patient/Family education, Self Care, Dry Needling, Cryotherapy, Taping, Manual therapy, Re-evaluation, and Pilates  .  PLAN FOR NEXT SESSION: HEP, step test, check HR recovery    Avonelle Viveros, PT 01/24/2022, 5:04 PM

## 2022-01-25 ENCOUNTER — Ambulatory Visit (HOSPITAL_COMMUNITY): Payer: 59 | Attending: Internal Medicine

## 2022-01-25 DIAGNOSIS — R002 Palpitations: Secondary | ICD-10-CM | POA: Insufficient documentation

## 2022-01-25 LAB — ECHOCARDIOGRAM COMPLETE
Area-P 1/2: 3.99 cm2
S' Lateral: 3 cm

## 2022-01-27 DIAGNOSIS — R002 Palpitations: Secondary | ICD-10-CM

## 2022-02-01 ENCOUNTER — Ambulatory Visit: Payer: 59 | Admitting: Physical Therapy

## 2022-02-01 ENCOUNTER — Encounter: Payer: Self-pay | Admitting: Physical Therapy

## 2022-02-01 DIAGNOSIS — M357 Hypermobility syndrome: Secondary | ICD-10-CM | POA: Diagnosis not present

## 2022-02-01 DIAGNOSIS — M2550 Pain in unspecified joint: Secondary | ICD-10-CM

## 2022-02-01 DIAGNOSIS — R6889 Other general symptoms and signs: Secondary | ICD-10-CM

## 2022-02-01 NOTE — Therapy (Signed)
OUTPATIENT PHYSICAL THERAPY TREATMENT NOTE   Patient Name: Dawn [name] MRN: [id number] DOB:Oct 10, 1990, 31 y.o., female Today's Date: 02/01/2022  PCP: none REFERRING PROVIDER: Dr. Virl Axe   END OF SESSION:   PT End of Session - 02/01/22 1313     Visit Number 2    Number of Visits 12    Date for PT Re-Evaluation 03/07/22    PT Start Time 0805    PT Stop Time 0852    PT Time Calculation (min) 47 min    Activity Tolerance Patient tolerated treatment well    Behavior During Therapy [facility] for tasks assessed/performed             Past Medical History:  Diagnosis Date   Breast mass 07/25/2016   Pain with lump   Family history of breast cancer    Family history of colon cancer    Family history of esophageal cancer    Family history of gene mutation    Family history of melanoma    Fracture of left foot 2007   treated with brace   Hx of degenerative disc disease    Migraine    with aura   Monoallelic mutation of CHEK2 gene in female patient    Oligomenorrhea    better on OCP   Sjogren's syndrome ([place]) 2018   Tendonitis    Past Surgical History:  Procedure Laterality Date   Cervical disk replacement  01/2021   fishbone removal     pt swallowed fishbone & had to be surgically removed   [place] CYST  08/17/2012   right shoulder area   [place] EXTRACTION     Patient Active Problem List   Diagnosis Date Noted   Palpitations 01/12/2022   Allergic contact dermatitis 17/35/6701   Monoallelic mutation of CHEK2 gene in female patient 12/30/2020   Genetic testing 12/29/2020   Family history of gene mutation 12/14/2020   Family history of breast cancer 12/14/2020   Family history of melanoma 12/14/2020   Family history of colon cancer 12/14/2020   Family history of esophageal cancer 12/14/2020   Recurrent epistaxis 07/27/2020   Degenerative disc disease, cervical 12/06/2018   Dysphagia, idiopathic 07/13/2017   Gastroesophageal  reflux disease without esophagitis 07/13/2017   History of Sjogren's disease ([place]) 07/13/2017    REFERRING DIAG: exercise intolerance, hypermobile   THERAPY DIAG:  Hypermobility syndrome  Polyarthralgia  Exercise intolerance  Rationale for Evaluation and Treatment Rehabilitation  PERTINENT HISTORY: see above   PRECAUTIONS: monitor BP and HRR   SUBJECTIVE: Pt wearing a Zio monitor for about 2 weeks.  No pain today. Goes to the gym 2-3 times per week.   PAIN:   Are you having pain? Yes: NPRS scale: 0/10 Pain location: variable Pain description: variable  Aggravating factors: sitting still Relieving factors: exercise usually  NONE RIGHT NOW   OBJECTIVE: (objective measures completed at initial evaluation unless otherwise dated) DIAGNOSTIC FINDINGS:  2020:  Normal MRI appearance of the lumbar spine.   PATIENT SURVEYS:  FOTO 60% [place]  scale given to patient for home  Beighton Scale Lumbar (_0/1) Knees (_0/2) Elbows (2_/2)  5th digit (_0/2) Thumb (_0/2) 2/9 Comment on hips, shoulders: Shoulders very flexible, can reach behind back and clasp hands in the middle Rt hip subluxes volitionally     COGNITION:           Overall cognitive status: Within functional limits for tasks assessed  SENSATION: Reports numbness and tingling in both feet intermittently    MUSCLE LENGTH: Hamstrings: Right 40 deg; Left 42 deg Thomas test: Right NT deg; Left NT deg   POSTURE: No Significant postural limitations   PALPATION: TTP lateral Rt hip, NT other than that  Mild hypermobility noted in thoracolumbar spine, no pain    LUMBAR ROM:    Active  A/PROM  eval  Flexion 4 inches from floor  Extension WNL/hyper   Right lateral flexion WNL   Left lateral flexion WNL   Right rotation WNL  Left rotation WNL  Patient moves quickly and with ease    LOWER EXTREMITY ROM:      Passive   Right eval Left eval  Hip flexion WNL WNL  Hip extension WNL  WNL  Hip abduction      Hip adduction      Hip internal rotation WNL, 50+ deg WNL 50+  Hip external rotation WNL 45-50 WNL 45-50  Knee flexion WNL  WNL  Knee extension 0 0  Ankle dorsiflexion      Ankle plantarflexion      Ankle inversion      Ankle eversion       (Blank rows = not tested)   LOWER EXTREMITY MMT:     MMT Right eval Left eval  Hip flexion 5 5  Hip extension 5 5  Hip abduction 4 4+  Hip adduction      Hip internal rotation      Hip external rotation      Knee flexion 5 5  Knee extension 5 5  Ankle dorsiflexion      Ankle plantarflexion      Ankle inversion      Ankle eversion       (Blank rows = not tested)       UPPER EXTREMITY ROM:             WFL throughout UEs  UPPER EXTREMITY MMT:   MMT Right 01/24/2022 Left 01/24/2022  Shoulder flexion 4+ 4+  Shoulder extension      Shoulder abduction 4+ 4+  Shoulder adduction      Shoulder internal rotation      Shoulder external rotation      Middle trapezius 4- 4-  Lower trapezius      Elbow flexion 5 5  Elbow extension 4 4  Wrist flexion      Wrist extension      Wrist ulnar deviation      Wrist radial deviation      Wrist pronation      Wrist supination      Grip strength (lbs)      (Blank rows = not tested)   JOINT MOBILITY TESTING:  NT   PALPATION:  NT   FUNCTIONAL TESTS:  2 minute walk test: 684 feet   GAIT: Distance walked: 684 Assistive device utilized: None Level of assistance: Complete Independence Comments: face pace, normal stride       TODAY'S TREATMENT   PT eval, including assessment of overall strength, joint mobility. 2 min walk. Establish POC>    [facility] Adult PT Treatment:                                                DATE: 02/01/22 Therapeutic Exercise: Recumbent bike 6 min L 2 , RPE 02-26-10  154/88,  HR 115  Breathing supine with added ball squeeze  Neutral spine A/P  March, knee extension, SLR x 10 each  Partial bridge with ball  x 10  Blue band bent knee  fall out x 10  Wall sit/slides , used ball at mid back Reverse slider lunge (glute) x 10 at wall  Self Care: Normal HR response and BO response to exercise Dizziness Holding on for balance and to counteract forces through knee Neutral spine and core     RHR 86 RBP 128/91    Orthostatics 100 128/90  Exercise - 30 sec step  159/101 HR 135  90 sec for HR return to baseline  after 3 min rest  142/93  End of session: 131/93  PATIENT EDUCATION:  Education details: POC, joint mobility, neutral spine  Person educated: Patient Education method: Customer service manager Education comprehension: verbalized understanding     HOME EXERCISE PROGRAM: Access Code: [email or web address] URL: [email or web address] Date: 02/01/2022 Prepared by: Raeford Razor  Exercises - Isometric Dead Bug  - 1 x daily - 7 x weekly - 1 sets - 5 reps - 30 hold - Clamshell with Resistance  - 1 x daily - 7 x weekly - 2 sets - 10 reps - 5 hold - Supine Dead Bug with Leg Extension  - 1 x daily - 7 x weekly - 2 sets - 10 reps - 30 hold - Standard Plank  - 1 x daily - 7 x weekly - 1 sets - 3-5 reps - 30-60 hold - Wall Squat  - 1 x daily - 7 x weekly - 2 sets - 10 reps - 10-30 holdNone yet    ASSESSMENT:   CLINICAL IMPRESSION: Patient doing well overall, no increased pain with exercises.  She already has quite a good program at the gym including [place], dead lift and UE machines.  Will review those next time and ensure form, reinforce principles to reduce risk of injury.  Encouraged her to hold for balance to focus on strengthening and reduce ROM in moves that increase crepitus or discomfort. Given HEP for neutral core.    OBJECTIVE IMPAIRMENTS cardiopulmonary status limiting activity, difficulty walking, decreased strength, dizziness, increased fascial restrictions, pain, and joint hypermobility  .    ACTIVITY LIMITATIONS carrying, lifting, bending, sitting, standing, squatting, and locomotion  level   PARTICIPATION LIMITATIONS: community activity, occupation, and fitness   PERSONAL FACTORS Time since onset of injury/illness/exacerbation and 1-2 comorbidities: multi-system , multijoint involvement  are also affecting patient's functional outcome.    REHAB POTENTIAL: Excellent   CLINICAL DECISION MAKING: Evolving/moderate complexity   EVALUATION COMPLEXITY: Moderate     GOALS: Goals reviewed with patient? No   SHORT TERM GOALS: Target date: 02/14/2022   Pt will be I with HEP for spinal stabilization, initial  Baseline: Goal status: INITIAL   2.  Pt will be able to show good body mechanics for lifting, hinging no wgt Baseline:  Goal status: INITIAL   3.  Pt will have baseline measures for cardiorespiratory endurance and goal set  Baseline:  Goal status: INITIAL     LONG TERM GOALS: Target date: 03/07/2022   Pt will be able to report consistent HEP at home with no increased in pain Baseline:  Goal status: INITIAL   2.  Pt will be able to demo core good strength and stability in neutral spine with intermediate level exercises Baseline:  Goal status: INITIAL   3.  Pt will be able to perform lifting in gym/home with good  control and knowledge of joint position, no increase in joint pain, including squats  Baseline:  Goal status: INITIAL   4.  Cardio goals TBA Baseline:  Goal status: INITIAL   5. Pt will be able to report mild increase in fatigue following extended periods of standing , walking in the community (> 2 hours) Baseline:  Goal status: INITIAL     PLAN: PT FREQUENCY: 1-2x/week   PT DURATION: 6 weeks   PLANNED INTERVENTIONS: Therapeutic exercises, Therapeutic activity, Neuromuscular re-education, Balance training, Gait training, Patient/Family education, Self Care, Dry Needling, Cryotherapy, Taping, Manual therapy, Re-evaluation, and Pilates  .   PLAN FOR NEXT SESSION: check HEP, endurance- monitor BP. Check in with RPE scale, aim for 11-13   Try dead lift, step ups/down, leg press    Jennifer Paa, PT 02/01/22 1:22 PM Phone: [phone] Fax: [phone]    PAA,JENNIFER, PT 02/01/2022, 1:22 PM    Jennifer Paa, PT 02/01/22 1:22 PM Phone: [phone] Fax: [phone]  

## 2022-02-03 ENCOUNTER — Encounter: Payer: Self-pay | Admitting: Physical Therapy

## 2022-02-03 ENCOUNTER — Ambulatory Visit: Payer: 59 | Admitting: Physical Therapy

## 2022-02-03 DIAGNOSIS — R6889 Other general symptoms and signs: Secondary | ICD-10-CM

## 2022-02-03 DIAGNOSIS — M2550 Pain in unspecified joint: Secondary | ICD-10-CM

## 2022-02-03 DIAGNOSIS — M357 Hypermobility syndrome: Secondary | ICD-10-CM

## 2022-02-03 NOTE — Therapy (Signed)
OUTPATIENT PHYSICAL THERAPY TREATMENT NOTE   Patient Name: Dawn [name] MRN: [id number] DOB:1991-06-13, 31 y.o., female Today's Date: 02/03/2022  PCP: none REFERRING PROVIDER: Dr. Virl Axe   END OF SESSION:   PT End of Session - 02/03/22 0806     Visit Number 3    Number of Visits 12    Date for PT Re-Evaluation 03/07/22    PT Start Time 0803    PT Stop Time 0845    PT Time Calculation (min) 42 min             Past Medical History:  Diagnosis Date   Breast mass 07/25/2016   Pain with lump   Family history of breast cancer    Family history of colon cancer    Family history of esophageal cancer    Family history of gene mutation    Family history of melanoma    Fracture of left foot 2007   treated with brace   Hx of degenerative disc disease    Migraine    with aura   Monoallelic mutation of CHEK2 gene in female patient    Oligomenorrhea    better on OCP   Sjogren's syndrome ([place]) 2018   Tendonitis    Past Surgical History:  Procedure Laterality Date   Cervical disk replacement  01/2021   fishbone removal     pt swallowed fishbone & had to be surgically removed   [place] CYST  08/17/2012   right shoulder area   [place] EXTRACTION     Patient Active Problem List   Diagnosis Date Noted   Palpitations 01/12/2022   Allergic contact dermatitis 69/48/5462   Monoallelic mutation of CHEK2 gene in female patient 12/30/2020   Genetic testing 12/29/2020   Family history of gene mutation 12/14/2020   Family history of breast cancer 12/14/2020   Family history of melanoma 12/14/2020   Family history of colon cancer 12/14/2020   Family history of esophageal cancer 12/14/2020   Recurrent epistaxis 07/27/2020   Degenerative disc disease, cervical 12/06/2018   Dysphagia, idiopathic 07/13/2017   Gastroesophageal reflux disease without esophagitis 07/13/2017   History of Sjogren's disease ([place]) 07/13/2017    REFERRING DIAG:  exercise intolerance, hypermobile   THERAPY DIAG:  Hypermobility syndrome  Polyarthralgia  Exercise intolerance  Rationale for Evaluation and Treatment Rehabilitation  PERTINENT HISTORY: see above   PRECAUTIONS: monitor BP and HRR   SUBJECTIVE: No pain this morning. Did not go to the gym today because I was coming here. No issues with HEP.   PAIN:   Are you having pain? Yes: NPRS scale: 0/10 Pain location: variable Pain description: variable  Aggravating factors: sitting still Relieving factors: exercise usually  NONE RIGHT NOW   OBJECTIVE: (objective measures completed at initial evaluation unless otherwise dated) DIAGNOSTIC FINDINGS:  2020:  Normal MRI appearance of the lumbar spine.   PATIENT SURVEYS:  FOTO 60% [place]  scale given to patient for home  Beighton Scale Lumbar (_0/1) Knees (_0/2) Elbows (2_/2)  5th digit (_0/2) Thumb (_0/2) 2/9 Comment on hips, shoulders: Shoulders very flexible, can reach behind back and clasp hands in the middle Rt hip subluxes volitionally     COGNITION:           Overall cognitive status: Within functional limits for tasks assessed                          SENSATION: Reports numbness and tingling  in both feet intermittently    MUSCLE LENGTH: Hamstrings: Right 40 deg; Left 42 deg Thomas test: Right NT deg; Left NT deg   POSTURE: No Significant postural limitations   PALPATION: TTP lateral Rt hip, NT other than that  Mild hypermobility noted in thoracolumbar spine, no pain    LUMBAR ROM:    Active  A/PROM  eval  Flexion 4 inches from floor  Extension WNL/hyper   Right lateral flexion WNL   Left lateral flexion WNL   Right rotation WNL  Left rotation WNL  Patient moves quickly and with ease    LOWER EXTREMITY ROM:      Passive   Right eval Left eval  Hip flexion WNL WNL  Hip extension WNL WNL  Hip abduction      Hip adduction      Hip internal rotation WNL, 50+ deg WNL 50+  Hip external rotation WNL  45-50 WNL 45-50  Knee flexion WNL  WNL  Knee extension 0 0  Ankle dorsiflexion      Ankle plantarflexion      Ankle inversion      Ankle eversion       (Blank rows = not tested)   LOWER EXTREMITY MMT:     MMT Right eval Left eval  Hip flexion 5 5  Hip extension 5 5  Hip abduction 4 4+  Hip adduction      Hip internal rotation      Hip external rotation      Knee flexion 5 5  Knee extension 5 5  Ankle dorsiflexion      Ankle plantarflexion      Ankle inversion      Ankle eversion       (Blank rows = not tested)       UPPER EXTREMITY ROM:             WFL throughout UEs  UPPER EXTREMITY MMT:   MMT Right 01/24/2022 Left 01/24/2022  Shoulder flexion 4+ 4+  Shoulder extension      Shoulder abduction 4+ 4+  Shoulder adduction      Shoulder internal rotation      Shoulder external rotation      Middle trapezius 4- 4-  Lower trapezius      Elbow flexion 5 5  Elbow extension 4 4  Wrist flexion      Wrist extension      Wrist ulnar deviation      Wrist radial deviation      Wrist pronation      Wrist supination      Grip strength (lbs)      (Blank rows = not tested)   JOINT MOBILITY TESTING:  NT   PALPATION:  NT   FUNCTIONAL TESTS:  2 minute walk test: 684 feet   GAIT: Distance walked: 684 Assistive device utilized: None Level of assistance: Complete Independence Comments: face pace, normal stride       TODAY'S TREATMENT   PT eval, including assessment of overall strength, joint mobility. 2 min walk. Establish POC>    [facility] Adult PT Treatment:     initial BP 138/88, HR 80 bpm                                           DATE: 02/03/22 Therapeutic Exercise: Rec Bike L2 x 6 min HR 112 bpm Ball squeeze  with draw ins  [facility] with partial bridge  Isometric Dead Bug 30 sec x 2 Dead Bug with Leg ext 10 x 2 - cues for neutral spine Plank on hands 45 sec Forearm plank 45 sec  Side clam blue band 10 x 2 each Hex bar lifting 55# 10 x 3, HR 118, BP  144/84, RPE 14- min cues for chin tuck otherwise good form [place] split squat 15# 10 x 2 each HR 127, BP 178/100 , RPE 15-16 -cues to avoid knee past toe, cues to slow down, stabilize as needed with UE- has knee pain in back knee   [facility] Adult PT Treatment:                                                DATE: 02/01/22 Therapeutic Exercise:  Recumbent bike 6 min L 2 , RPE 02-26-10  154/88, HR 115  Breathing supine with added ball squeeze  Neutral spine A/P  March, knee extension, SLR x 10 each  Partial bridge with ball  x 10  Blue band bent knee fall out x 10  Wall sit/slides , used ball at mid back Reverse slider lunge (glute) x 10 at wall  Self Care: Normal HR response and BO response to exercise Dizziness Holding on for balance and to counteract forces through knee Neutral spine and core     RHR 86 RBP 128/91    Orthostatics 100 128/90  Exercise - 30 sec step  159/101 HR 135  90 sec for HR return to baseline  after 3 min rest  142/93  End of session: 131/93  PATIENT EDUCATION:  Education details: POC, joint mobility, neutral spine  Person educated: Patient Education method: Customer service manager Education comprehension: verbalized understanding     HOME EXERCISE PROGRAM: Access Code: [email or web address] URL: [email or web address] Date: 02/01/2022 Prepared by: Raeford Razor  Exercises - Isometric Dead Bug  - 1 x daily - 7 x weekly - 1 sets - 5 reps - 30 hold - Clamshell with Resistance  - 1 x daily - 7 x weekly - 2 sets - 10 reps - 5 hold - Supine Dead Bug with Leg Extension  - 1 x daily - 7 x weekly - 2 sets - 10 reps - 30 hold - Standard Plank  - 1 x daily - 7 x weekly - 1 sets - 3-5 reps - 30-60 hold - Wall Squat  - 1 x daily - 7 x weekly - 2 sets - 10 reps - 10-30 holdNone yet    ASSESSMENT:   CLINICAL IMPRESSION: Patient reports compliance with core HEP while at the gym. Reviewed her HEP and then reviewed her lifting techniques. She  required min cue for chin tuck with dead lift and min cue for avoiding excessive knee flexion with [place] split squat, otherwise good form. Encouraged slower pace with reps and use of UE to stabilize as needed with split squats. She does have some knee pain in the back leg with split squats. Will look at UE machines and free weights next visit. BP did elevate to 178/100 after [place] Split squats. Mild dizziness during dead lifts.      OBJECTIVE IMPAIRMENTS cardiopulmonary status limiting activity, difficulty walking, decreased strength, dizziness, increased fascial restrictions, pain, and joint hypermobility  .    ACTIVITY LIMITATIONS carrying, lifting, bending, sitting, standing, squatting, and locomotion  level   PARTICIPATION LIMITATIONS: community activity, occupation, and fitness   PERSONAL FACTORS Time since onset of injury/illness/exacerbation and 1-2 comorbidities: multi-system , multijoint involvement  are also affecting patient's functional outcome.    REHAB POTENTIAL: Excellent   CLINICAL DECISION MAKING: Evolving/moderate complexity   EVALUATION COMPLEXITY: Moderate     GOALS: Goals reviewed with patient? No   SHORT TERM GOALS: Target date: 02/14/2022   Pt will be I with HEP for spinal stabilization, initial  Baseline: Goal status: INITIAL   2.  Pt will be able to show good body mechanics for lifting, hinging no wgt Baseline:  Goal status: INITIAL   3.  Pt will have baseline measures for cardiorespiratory endurance and goal set  Baseline:  Goal status: INITIAL     LONG TERM GOALS: Target date: 03/07/2022   Pt will be able to report consistent HEP at home with no increased in pain Baseline:  Goal status: INITIAL   2.  Pt will be able to demo core good strength and stability in neutral spine with intermediate level exercises Baseline:  Goal status: INITIAL   3.  Pt will be able to perform lifting in gym/home with good control and knowledge of joint position,  no increase in joint pain, including squats  Baseline:  Goal status: INITIAL   4.  Cardio goals TBA Baseline:  Goal status: INITIAL   5. Pt will be able to report mild increase in fatigue following extended periods of standing , walking in the community (> 2 hours) Baseline:  Goal status: INITIAL     PLAN: PT FREQUENCY: 1-2x/week   PT DURATION: 6 weeks   PLANNED INTERVENTIONS: Therapeutic exercises, Therapeutic activity, Neuromuscular re-education, Balance training, Gait training, Patient/Family education, Self Care, Dry Needling, Cryotherapy, Taping, Manual therapy, Re-evaluation, and Pilates  .   PLAN FOR NEXT SESSION: check HEP, endurance- monitor BP. Check in with RPE scale, aim for 11-13  Try dead lift, step ups/down, leg press , check form with UE machines and free weights OH next visit   Raeford Razor, PT 02/03/22 9:54 AM Phone: [phone] Fax: [phone]    Hessie Diener, PTA 02/03/22 9:54 AM Phone: [phone] Fax: [phone]

## 2022-02-08 ENCOUNTER — Encounter: Payer: Self-pay | Admitting: Physical Therapy

## 2022-02-08 ENCOUNTER — Ambulatory Visit: Payer: 59 | Admitting: Physical Therapy

## 2022-02-08 DIAGNOSIS — R6889 Other general symptoms and signs: Secondary | ICD-10-CM

## 2022-02-08 DIAGNOSIS — M357 Hypermobility syndrome: Secondary | ICD-10-CM | POA: Diagnosis not present

## 2022-02-08 DIAGNOSIS — M2550 Pain in unspecified joint: Secondary | ICD-10-CM

## 2022-02-08 NOTE — Therapy (Signed)
OUTPATIENT PHYSICAL THERAPY TREATMENT NOTE   Patient Name: Dawn [name] MRN: [id number] DOB:[birth year], 31 y.o., female Today's Date: 02/08/2022  PCP: none REFERRING PROVIDER: Dr. Virl Axe   END OF SESSION:   PT End of Session - 02/08/22 0811     Visit Number 4    Number of Visits 12    Date for PT Re-Evaluation 03/07/22    PT Start Time 0807    PT Stop Time 0850    PT Time Calculation (min) 43 min             Past Medical History:  Diagnosis Date   Breast mass 07/25/2016   Pain with lump   Family history of breast cancer    Family history of colon cancer    Family history of esophageal cancer    Family history of gene mutation    Family history of melanoma    Fracture of left foot 2007   treated with brace   Hx of degenerative disc disease    Migraine    with aura   Monoallelic mutation of CHEK2 gene in female patient    Oligomenorrhea    better on OCP   Sjogren's syndrome ([place]) 2018   Tendonitis    Past Surgical History:  Procedure Laterality Date   Cervical disk replacement  01/2021   fishbone removal     pt swallowed fishbone & had to be surgically removed   [place] CYST  08/17/2012   right shoulder area   [place] EXTRACTION     Patient Active Problem List   Diagnosis Date Noted   Palpitations 01/12/2022   Allergic contact dermatitis 40/98/1191   Monoallelic mutation of CHEK2 gene in female patient 12/30/2020   Genetic testing 12/29/2020   Family history of gene mutation 12/14/2020   Family history of breast cancer 12/14/2020   Family history of melanoma 12/14/2020   Family history of colon cancer 12/14/2020   Family history of esophageal cancer 12/14/2020   Recurrent epistaxis 07/27/2020   Degenerative disc disease, cervical 12/06/2018   Dysphagia, idiopathic 07/13/2017   Gastroesophageal reflux disease without esophagitis 07/13/2017   History of Sjogren's disease ([place]) 07/13/2017    REFERRING DIAG:  exercise intolerance, hypermobile   THERAPY DIAG:  Hypermobility syndrome  Polyarthralgia  Exercise intolerance  Rationale for Evaluation and Treatment Rehabilitation  PERTINENT HISTORY: see above   PRECAUTIONS: monitor BP and HRR   SUBJECTIVE: No pain this morning. Did not go to the gym today because I was coming here. No issues with HEP.   PAIN:   Are you having pain? Yes: NPRS scale: 0/10 Pain location: variable Pain description: variable  Aggravating factors: sitting still Relieving factors: exercise usually  NONE RIGHT NOW   OBJECTIVE: (objective measures completed at initial evaluation unless otherwise dated) DIAGNOSTIC FINDINGS:  2020:  Normal MRI appearance of the lumbar spine.   PATIENT SURVEYS:  FOTO 60% [place]  scale given to patient for home  Beighton Scale Lumbar (_0/1) Knees (_0/2) Elbows (2_/2)  5th digit (_0/2) Thumb (_0/2) 2/9 Comment on hips, shoulders: Shoulders very flexible, can reach behind back and clasp hands in the middle Rt hip subluxes volitionally     COGNITION:           Overall cognitive status: Within functional limits for tasks assessed                          SENSATION: Reports numbness and tingling  in both feet intermittently    MUSCLE LENGTH: Hamstrings: Right 40 deg; Left 42 deg Thomas test: Right NT deg; Left NT deg   POSTURE: No Significant postural limitations   PALPATION: TTP lateral Rt hip, NT other than that  Mild hypermobility noted in thoracolumbar spine, no pain    LUMBAR ROM:    Active  A/PROM  eval  Flexion 4 inches from floor  Extension WNL/hyper   Right lateral flexion WNL   Left lateral flexion WNL   Right rotation WNL  Left rotation WNL  Patient moves quickly and with ease    LOWER EXTREMITY ROM:      Passive   Right eval Left eval  Hip flexion WNL WNL  Hip extension WNL WNL  Hip abduction      Hip adduction      Hip internal rotation WNL, 50+ deg WNL 50+  Hip external rotation WNL  45-50 WNL 45-50  Knee flexion WNL  WNL  Knee extension 0 0  Ankle dorsiflexion      Ankle plantarflexion      Ankle inversion      Ankle eversion       (Blank rows = not tested)   LOWER EXTREMITY MMT:     MMT Right eval Left eval  Hip flexion 5 5  Hip extension 5 5  Hip abduction 4 4+  Hip adduction      Hip internal rotation      Hip external rotation      Knee flexion 5 5  Knee extension 5 5  Ankle dorsiflexion      Ankle plantarflexion      Ankle inversion      Ankle eversion       (Blank rows = not tested)       UPPER EXTREMITY ROM:             WFL throughout UEs  UPPER EXTREMITY MMT:   MMT Right 01/24/2022 Left 01/24/2022  Shoulder flexion 4+ 4+  Shoulder extension      Shoulder abduction 4+ 4+  Shoulder adduction      Shoulder internal rotation      Shoulder external rotation      Middle trapezius 4- 4-  Lower trapezius      Elbow flexion 5 5  Elbow extension 4 4  Wrist flexion      Wrist extension      Wrist ulnar deviation      Wrist radial deviation      Wrist pronation      Wrist supination      Grip strength (lbs)      (Blank rows = not tested)   JOINT MOBILITY TESTING:  NT   PALPATION:  NT   FUNCTIONAL TESTS:  2 minute walk test: 684 feet   GAIT: Distance walked: 684 Assistive device utilized: None Level of assistance: Complete [place] Comments: face pace, normal stride       TODAY'S TREATMENT   PT eval, including assessment of overall strength, joint mobility. 2 min walk. Establish POC>   [facility] Adult PT Treatment:   Initial BP 132/86, HR 77                                   DATE: 02/08/22 Therapeutic Exercise: Rec bike x 5 minutes  Curl to [facility] Press 10# bilat  Bent over row - cues for position- cues for pace  Bent over tricep press 9# bilat Cable Lat Pull 20# Row 20#  Runners Step ups 10# in hands- x 10 each alternating BP 159/77, HR 110 Runners step up with [place] press 8# alternating x 10  BP 153/100, HR 136 Runners Step  up with [place] press 10# alternating x 12  BP 148/101, HR 135      [facility] Adult PT Treatment:     initial BP 138/88, HR 80 bpm                                           DATE: 02/08/22 Therapeutic Exercise: Rec Bike L2 x 6 min HR 112 bpm Ball squeeze with draw ins  [facility] with partial bridge  Isometric Dead Bug 30 sec x 2 Dead Bug with Leg ext 10 x 2 - cues for neutral spine Plank on hands 45 sec Forearm plank 45 sec  Side clam blue band 10 x 2 each Hex bar lifting 55# 10 x 3, HR 118, BP 144/84, RPE 14- min cues for chin tuck otherwise good form [place] split squat 15# 10 x 2 each HR 127, BP 178/100 , RPE 15-16 -cues to avoid knee past toe, cues to slow down, stabilize as needed with UE- has knee pain in back knee   [facility] Adult PT Treatment:     initial BP 138/88, HR 80 bpm                                           DATE: 02/03/22 Therapeutic Exercise: Rec Bike L2 x 6 min HR 112 bpm Ball squeeze with draw ins  [facility] with partial bridge  Isometric Dead Bug 30 sec x 2 Dead Bug with Leg ext 10 x 2 - cues for neutral spine Plank on hands 45 sec Forearm plank 45 sec  Side clam blue band 10 x 2 each Hex bar lifting 55# 10 x 3, HR 118, BP 144/84, RPE 14- min cues for chin tuck otherwise good form [place] split squat 15# 10 x 2 each HR 127, BP 178/100 , RPE 15-16 -cues to avoid knee past toe, cues to slow down, stabilize as needed with UE- has knee pain in back knee   [facility] Adult PT Treatment:                                                DATE: 02/01/22 Therapeutic Exercise:  Recumbent bike 6 min L 2 , RPE 02-26-10  154/88, HR 115  Breathing supine with added ball squeeze  Neutral spine A/P  March, knee extension, SLR x 10 each  Partial bridge with ball  x 10  Blue band bent knee fall out x 10  Wall sit/slides , used ball at mid back Reverse slider lunge (glute) x 10 at wall  Self Care: Normal HR response and BO response to exercise Dizziness Holding on for balance and to  counteract forces through knee Neutral spine and core     RHR 86 RBP 128/91    Orthostatics 100 128/90  Exercise - 30 sec step  159/101 HR 135  90 sec for HR return to baseline  after 3 min rest  142/93  End of session: 131/93  PATIENT EDUCATION:  Education details: POC, joint mobility, neutral spine  Person educated: Patient Education method: Customer service manager Education comprehension: verbalized understanding     HOME EXERCISE PROGRAM: Access Code: [email or web address] URL: [email or web address] Date: 02/01/2022 Prepared by: Raeford Razor  Exercises - Isometric Dead Bug  - 1 x daily - 7 x weekly - 1 sets - 5 reps - 30 hold - Clamshell with Resistance  - 1 x daily - 7 x weekly - 2 sets - 10 reps - 5 hold - Supine Dead Bug with Leg Extension  - 1 x daily - 7 x weekly - 2 sets - 10 reps - 30 hold - Standard Plank  - 1 x daily - 7 x weekly - 1 sets - 3-5 reps - 30-60 hold - Wall Squat  - 1 x daily - 7 x weekly - 2 sets - 10 reps - 10-30 holdNone yet    ASSESSMENT:   CLINICAL IMPRESSION: Patient reports compliance with HEP. Reviewed mechanics with Upper extremity routine and free weights. Min cues- does well. Needs to slow down and is working on this.  Mild dizziness with return from bent over rows. Worked on conditioning, adding UE movements. BP increase to 160/103 after step up with [facility] press. Will consider dynamic lunge/ squat to [facility] press next visit.      OBJECTIVE IMPAIRMENTS cardiopulmonary status limiting activity, difficulty walking, decreased strength, dizziness, increased fascial restrictions, pain, and joint hypermobility  .    ACTIVITY LIMITATIONS carrying, lifting, bending, sitting, standing, squatting, and locomotion level   PARTICIPATION LIMITATIONS: community activity, occupation, and fitness   PERSONAL FACTORS Time since onset of injury/illness/exacerbation and 1-2 comorbidities: multi-system , multijoint involvement  are also affecting  patient's functional outcome.    REHAB POTENTIAL: Excellent   CLINICAL DECISION MAKING: Evolving/moderate complexity   EVALUATION COMPLEXITY: Moderate     GOALS: Goals reviewed with patient? No   SHORT TERM GOALS: Target date: 02/14/2022   Pt will be I with HEP for spinal stabilization, initial  Baseline: Goal status: INITIAL   2.  Pt will be able to show good body mechanics for lifting, hinging no wgt Baseline:  Goal status: INITIAL   3.  Pt will have baseline measures for cardiorespiratory endurance and goal set  Baseline:  Goal status: INITIAL     LONG TERM GOALS: Target date: 03/07/2022   Pt will be able to report consistent HEP at home with no increased in pain Baseline:  Goal status: INITIAL   2.  Pt will be able to demo core good strength and stability in neutral spine with intermediate level exercises Baseline:  Goal status: INITIAL   3.  Pt will be able to perform lifting in gym/home with good control and knowledge of joint position, no increase in joint pain, including squats  Baseline:  Goal status: INITIAL   4.  Cardio goals TBA Baseline:  Goal status: INITIAL   5. Pt will be able to report mild increase in fatigue following extended periods of standing , walking in the community (> 2 hours) Baseline:  Goal status: INITIAL     PLAN: PT FREQUENCY: 1-2x/week   PT DURATION: 6 weeks   PLANNED INTERVENTIONS: Therapeutic exercises, Therapeutic activity, Neuromuscular re-education, Balance training, Gait training, Patient/Family education, Self Care, Dry Needling, Cryotherapy, Taping, Manual therapy, Re-evaluation, and Pilates  .   PLAN FOR NEXT SESSION: check HEP, endurance- monitor BP. Check in with RPE  scale, aim for 11-13  Try dead lift, step ups/down, leg press , conditioning   Raeford Razor, PT 02/08/22 9:21 AM Phone: [phone] Fax: [phone]    Hessie Diener, PTA 02/08/22 9:21 AM Phone: [phone] Fax: [phone]

## 2022-02-10 ENCOUNTER — Encounter: Payer: Self-pay | Admitting: Physical Therapy

## 2022-02-10 ENCOUNTER — Ambulatory Visit: Payer: 59 | Admitting: Physical Therapy

## 2022-02-10 VITALS — BP 145/94 | HR 84

## 2022-02-10 DIAGNOSIS — M2550 Pain in unspecified joint: Secondary | ICD-10-CM

## 2022-02-10 DIAGNOSIS — M357 Hypermobility syndrome: Secondary | ICD-10-CM | POA: Diagnosis not present

## 2022-02-10 DIAGNOSIS — R6889 Other general symptoms and signs: Secondary | ICD-10-CM

## 2022-02-10 NOTE — Therapy (Signed)
OUTPATIENT PHYSICAL THERAPY TREATMENT NOTE   Patient Name: Dawn [name] MRN: [id number] DOB:Sep 08, 1990, 31 y.o., female Today's Date: 02/10/2022  PCP: none REFERRING PROVIDER: Dr. Virl Axe   END OF SESSION:   PT End of Session - 02/10/22 0810     Visit Number 5    Number of Visits 12    Date for PT Re-Evaluation 03/07/22    PT Start Time 0804    PT Stop Time 0850    PT Time Calculation (min) 46 min             Past Medical History:  Diagnosis Date   Breast mass 07/25/2016   Pain with lump   Family history of breast cancer    Family history of colon cancer    Family history of esophageal cancer    Family history of gene mutation    Family history of melanoma    Fracture of left foot 2007   treated with brace   Hx of degenerative disc disease    Migraine    with aura   Monoallelic mutation of CHEK2 gene in female patient    Oligomenorrhea    better on OCP   Sjogren's syndrome ([place]) 2018   Tendonitis    Past Surgical History:  Procedure Laterality Date   Cervical disk replacement  01/2021   fishbone removal     pt swallowed fishbone & had to be surgically removed   [place] CYST  08/17/2012   right shoulder area   [facility] EXTRACTION     Patient Active Problem List   Diagnosis Date Noted   Palpitations 01/12/2022   Allergic contact dermatitis 32/44/0102   Monoallelic mutation of CHEK2 gene in female patient 12/30/2020   Genetic testing 12/29/2020   Family history of gene mutation 12/14/2020   Family history of breast cancer 12/14/2020   Family history of melanoma 12/14/2020   Family history of colon cancer 12/14/2020   Family history of esophageal cancer 12/14/2020   Recurrent epistaxis 07/27/2020   Degenerative disc disease, cervical 12/06/2018   Dysphagia, idiopathic 07/13/2017   Gastroesophageal reflux disease without esophagitis 07/13/2017   History of Sjogren's disease ([place]) 07/13/2017    REFERRING DIAG:  exercise intolerance, hypermobile   THERAPY DIAG:  Hypermobility syndrome  Polyarthralgia  Exercise intolerance  Rationale for Evaluation and Treatment Rehabilitation  PERTINENT HISTORY: see above   PRECAUTIONS: monitor BP and HRR   SUBJECTIVE: My shoulder were a little sore after last time. My right shoulder is uncomfortable this morning.    PAIN:   Are you having pain? Yes: NPRS scale: 0/10 Pain location: variable Pain description: variable  Aggravating factors: sitting still Relieving factors: exercise usually  NONE RIGHT NOW   OBJECTIVE: (objective measures completed at initial evaluation unless otherwise dated) DIAGNOSTIC FINDINGS:  2020:  Normal MRI appearance of the lumbar spine.   PATIENT SURVEYS:  FOTO 60% [place]  scale given to patient for home  Beighton Scale Lumbar (_0/1) Knees (_0/2) Elbows (2_/2)  5th digit (_0/2) Thumb (_0/2) 2/9 Comment on hips, shoulders: Shoulders very flexible, can reach behind back and clasp hands in the middle Rt hip subluxes volitionally     COGNITION:           Overall cognitive status: Within functional limits for tasks assessed                          SENSATION: Reports numbness and tingling in both feet  intermittently    MUSCLE LENGTH: Hamstrings: Right 40 deg; Left 42 deg Thomas test: Right NT deg; Left NT deg   POSTURE: No Significant postural limitations   PALPATION: TTP lateral Rt hip, NT other than that  Mild hypermobility noted in thoracolumbar spine, no pain    LUMBAR ROM:    Active  A/PROM  eval  Flexion 4 inches from floor  Extension WNL/hyper   Right lateral flexion WNL   Left lateral flexion WNL   Right rotation WNL  Left rotation WNL  Patient moves quickly and with ease    LOWER EXTREMITY ROM:      Passive   Right eval Left eval  Hip flexion WNL WNL  Hip extension WNL WNL  Hip abduction      Hip adduction      Hip internal rotation WNL, 50+ deg WNL 50+  Hip external rotation  WNL 45-50 WNL 45-50  Knee flexion WNL  WNL  Knee extension 0 0  Ankle dorsiflexion      Ankle plantarflexion      Ankle inversion      Ankle eversion       (Blank rows = not tested)   LOWER EXTREMITY MMT:     MMT Right eval Left eval  Hip flexion 5 5  Hip extension 5 5  Hip abduction 4 4+  Hip adduction      Hip internal rotation      Hip external rotation      Knee flexion 5 5  Knee extension 5 5  Ankle dorsiflexion      Ankle plantarflexion      Ankle inversion      Ankle eversion       (Blank rows = not tested)       UPPER EXTREMITY ROM:             WFL throughout UEs  UPPER EXTREMITY MMT:   MMT Right 01/24/2022 Left 01/24/2022  Shoulder flexion 4+ 4+  Shoulder extension      Shoulder abduction 4+ 4+  Shoulder adduction      Shoulder internal rotation      Shoulder external rotation      Middle trapezius 4- 4-  Lower trapezius      Elbow flexion 5 5  Elbow extension 4 4  Wrist flexion      Wrist extension      Wrist ulnar deviation      Wrist radial deviation      Wrist pronation      Wrist supination      Grip strength (lbs)      (Blank rows = not tested)   JOINT MOBILITY TESTING:  NT   PALPATION:  NT   FUNCTIONAL TESTS:  2 minute walk test: 684 feet   GAIT: Distance walked: 684 Assistive device utilized: None Level of assistance: Complete [place] Comments: face pace, normal stride       TODAY'S TREATMENT   PT eval, including assessment of overall strength, joint mobility. 2 min walk. Establish POC>   [facility] Adult PT Treatment:                                                DATE: 02/10/22 Therapeutic Exercise:  HR MAX 187   TARGET  HR 112-150 (60-80%)  T.M (15 minutes total) : Grade 3, 3.3  mph warm up 3.5 minutes, grade 10 x 2 min HR 144, 2 minutes at warm up speed (HR down to 133) , 2 min grade 10 HR 151, reduce to warm up speed for 2 minutes HR down to 131. BP after 193/63, 115 HR Runners step up with OH press 8# alternating x  10 Starting HR 102 : Runners step up 8 inch with OH press 8# alternating x 10: HR 126; 166/98 BP (2.5 min to reduce HR to under 100 bpm) Runners step up with OH press 8# alternating x 15 191/86; HR 141 ( 4 min min rest to reduce HR to under 100) Runners step up with OH press 8# alternating x 15  148/101 HR 156-min dizziness (5 min rest to reduce HR to under 100)  20 sec 15# KB swing, rest 1 minute , repeat for 20 sec , HR 150, 129/100 -min dizziness (6 minutes to return HR to less than 100)   [facility] Adult PT Treatment:   Initial BP 132/86, HR 77                                   DATE: 02/08/22 Therapeutic Exercise: Rec bike x 5 minutes  Curl to [facility] Press 10# bilat  Bent over row - cues for position- cues for pace  Bent over tricep press 9# bilat Cable Lat Pull 20# Row 20#  Runners Step ups 10# in hands- x 10 each alternating BP 159/77, HR 110 Runners step up with OH press 8# alternating x 10  BP 153/100, HR 136 Runners Step up with OH press 10# alternating x 12  BP 148/101, HR 135      [facility] Adult PT Treatment:     initial BP 138/88, HR 80 bpm                                           DATE: 02/08/22 Therapeutic Exercise: Rec Bike L2 x 6 min HR 112 bpm Ball squeeze with draw ins  [facility] with partial bridge  Isometric Dead Bug 30 sec x 2 Dead Bug with Leg ext 10 x 2 - cues for neutral spine Plank on hands 45 sec Forearm plank 45 sec  Side clam blue band 10 x 2 each Hex bar lifting 55# 10 x 3, HR 118, BP 144/84, RPE 14- min cues for chin tuck otherwise good form [place] split squat 15# 10 x 2 each HR 127, BP 178/100 , RPE 15-16 -cues to avoid knee past toe, cues to slow down, stabilize as needed with UE- has knee pain in back knee   [facility] Adult PT Treatment:     initial BP 138/88, HR 80 bpm                                           DATE: 02/03/22 Therapeutic Exercise: Rec Bike L2 x 6 min HR 112 bpm Ball squeeze with draw ins  [facility] with partial bridge  Isometric Dead Bug  30 sec x 2 Dead Bug with Leg ext 10 x 2 - cues for neutral spine Plank on hands 45 sec Forearm plank 45 sec  Side clam blue band 10 x 2 each Hex bar  lifting 55# 10 x 3, HR 118, BP 144/84, RPE 14- min cues for chin tuck otherwise good form [place] split squat 15# 10 x 2 each HR 127, BP 178/100 , RPE 15-16 -cues to avoid knee past toe, cues to slow down, stabilize as needed with UE- has knee pain in back knee   [facility] Adult PT Treatment:                                                DATE: 02/01/22 Therapeutic Exercise:  Recumbent bike 6 min L 2 , RPE 02-26-10  154/88, HR 115  Breathing supine with added ball squeeze  Neutral spine A/P  March, knee extension, SLR x 10 each  Partial bridge with ball  x 10  Blue band bent knee fall out x 10  Wall sit/slides , used ball at mid back Reverse slider lunge (glute) x 10 at wall  Self Care: Normal HR response and BO response to exercise Dizziness Holding on for balance and to counteract forces through knee Neutral spine and core     RHR 86 RBP 128/91    Orthostatics 100 128/90  Exercise - 30 sec step  159/101 HR 135  90 sec for HR return to baseline  after 3 min rest  142/93  End of session: 131/93  PATIENT EDUCATION:  Education details: POC, joint mobility, neutral spine  Person educated: Patient Education method: Medical illustrator Education comprehension: verbalized understanding     HOME EXERCISE PROGRAM: Access Code: [email or web address] URL: [email or web address] Date: 02/01/2022 Prepared by: Karie Mainland  Exercises - Isometric Dead Bug  - 1 x daily - 7 x weekly - 1 sets - 5 reps - 30 hold - Clamshell with Resistance  - 1 x daily - 7 x weekly - 2 sets - 10 reps - 5 hold - Supine Dead Bug with Leg Extension  - 1 x daily - 7 x weekly - 2 sets - 10 reps - 30 hold - Standard Plank  - 1 x daily - 7 x weekly - 1 sets - 3-5 reps - 30-60 hold - Wall Squat  - 1 x daily - 7 x weekly - 2 sets - 10 reps -  10-30 holdNone yet    ASSESSMENT:   CLINICAL IMPRESSION: Session spent with conditioning and education on HR Max and Target HR Zone. Used Graded T.M, Step ups, and KB swings for conditioning today. HR and BP quickly elevated with KB swings which is new to her. Worked to top of HR zone (150 bpm) with rest breaks to return HR to 100 bpm. 2.5  to 6 minute rest required for HR to decrease to 100 bpm between exercises.   Mild dizziness with last 2  exercises. Patient will continue 1 x per week for remainder of POC and has met her STGS.      OBJECTIVE IMPAIRMENTS cardiopulmonary status limiting activity, difficulty walking, decreased strength, dizziness, increased fascial restrictions, pain, and joint hypermobility  .    ACTIVITY LIMITATIONS carrying, lifting, bending, sitting, standing, squatting, and locomotion level   PARTICIPATION LIMITATIONS: community activity, occupation, and fitness   PERSONAL FACTORS Time since onset of injury/illness/exacerbation and 1-2 comorbidities: multi-system , multijoint involvement  are also affecting patient's functional outcome.    REHAB POTENTIAL: Excellent   CLINICAL DECISION MAKING: Evolving/moderate complexity   EVALUATION COMPLEXITY: Moderate  GOALS: Goals reviewed with patient? No   SHORT TERM GOALS: Target date: 02/14/2022   Pt will be I with HEP for spinal stabilization, initial  Baseline: Goal status: MET   2.  Pt will be able to show good body mechanics for lifting, hinging no wgt Baseline:  Goal status: MET   3.  Pt will have baseline measures for cardiorespiratory endurance and goal set  Baseline:  Goal status: PARTIALLY MET     LONG TERM GOALS: Target date: 03/07/2022   Pt will be able to report consistent HEP at home with no increased in pain Baseline:  Goal status: INITIAL   2.  Pt will be able to demo core good strength and stability in neutral spine with intermediate level exercises Baseline:  Goal status: INITIAL    3.  Pt will be able to perform lifting in gym/home with good control and knowledge of joint position, no increase in joint pain, including squats  Baseline:  Goal status: INITIAL   4.  Cardio goals TBA Baseline:  Goal status: INITIAL   5. Pt will be able to report mild increase in fatigue following extended periods of standing , walking in the community (> 2 hours) Baseline:  Goal status: INITIAL     PLAN: PT FREQUENCY: 1-2x/week   PT DURATION: 6 weeks   PLANNED INTERVENTIONS: Therapeutic exercises, Therapeutic activity, Neuromuscular re-education, Balance training, Gait training, Patient/Family education, Self Care, Dry Needling, Cryotherapy, Taping, Manual therapy, Re-evaluation, and Pilates  .   PLAN FOR NEXT SESSION: check HEP, endurance- monitor BP. Check in with RPE scale, aim for 11-13  Try dead lift, step ups/down, leg press , conditioning, check LTGS. FOTO?       Hessie Diener, PTA 02/10/22 9:26 AM Phone: [phone] Fax: [phone]

## 2022-02-15 ENCOUNTER — Ambulatory Visit: Payer: 59 | Admitting: Physical Therapy

## 2022-02-17 ENCOUNTER — Encounter: Payer: Self-pay | Admitting: Physical Therapy

## 2022-02-17 ENCOUNTER — Ambulatory Visit: Payer: 59 | Attending: Internal Medicine | Admitting: Physical Therapy

## 2022-02-17 DIAGNOSIS — M357 Hypermobility syndrome: Secondary | ICD-10-CM | POA: Diagnosis present

## 2022-02-17 DIAGNOSIS — M2550 Pain in unspecified joint: Secondary | ICD-10-CM | POA: Diagnosis present

## 2022-02-17 DIAGNOSIS — R6889 Other general symptoms and signs: Secondary | ICD-10-CM | POA: Insufficient documentation

## 2022-02-17 NOTE — Therapy (Signed)
OUTPATIENT PHYSICAL THERAPY TREATMENT NOTE   Patient Name: Dawn [name] MRN: [id number] DOB:23-Nov-1990, 31 y.o., female Today's Date: 02/17/2022  PCP: none REFERRING PROVIDER: Dr. Virl Axe   END OF SESSION:   PT End of Session - 02/17/22 0806     Visit Number 6    Number of Visits 12    Date for PT Re-Evaluation 03/07/22    PT Start Time 0802    PT Stop Time 0845    PT Time Calculation (min) 43 min    Activity Tolerance Patient tolerated treatment well    Behavior During Therapy [facility] for tasks assessed/performed              Past Medical History:  Diagnosis Date   Breast mass 07/25/2016   Pain with lump   Family history of breast cancer    Family history of colon cancer    Family history of esophageal cancer    Family history of gene mutation    Family history of melanoma    Fracture of left foot 2007   treated with brace   Hx of degenerative disc disease    Migraine    with aura   Monoallelic mutation of CHEK2 gene in female patient    Oligomenorrhea    better on OCP   Sjogren's syndrome ([place]) 2018   Tendonitis    Past Surgical History:  Procedure Laterality Date   Cervical disk replacement  01/2021   fishbone removal     pt swallowed fishbone & had to be surgically removed   [place] CYST  08/17/2012   right shoulder area   [place] EXTRACTION     Patient Active Problem List   Diagnosis Date Noted   Palpitations 01/12/2022   Allergic contact dermatitis 23/55/7322   Monoallelic mutation of CHEK2 gene in female patient 12/30/2020   Genetic testing 12/29/2020   Family history of gene mutation 12/14/2020   Family history of breast cancer 12/14/2020   Family history of melanoma 12/14/2020   Family history of colon cancer 12/14/2020   Family history of esophageal cancer 12/14/2020   Recurrent epistaxis 07/27/2020   Degenerative disc disease, cervical 12/06/2018   Dysphagia, idiopathic 07/13/2017   Gastroesophageal  reflux disease without esophagitis 07/13/2017   History of Sjogren's disease ([place]) 07/13/2017    REFERRING DIAG: exercise intolerance, hypermobile   THERAPY DIAG:  Hypermobility syndrome  Polyarthralgia  Exercise intolerance  Rationale for Evaluation and Treatment Rehabilitation  PERTINENT HISTORY: see above   PRECAUTIONS: monitor BP and HRR   SUBJECTIVE: Some pain in Rt shoulder, pinky.  Hips have been sore.  No pain right now.  Returned from [place] and really did not do much this week other than walk (no gym).   PAIN:   Are you having pain? Yes: NPRS scale: 0/10 Pain location: variable Pain description: variable  Aggravating factors: sitting still Relieving factors: exercise usually    OBJECTIVE: (objective measures completed at initial evaluation unless otherwise dated) DIAGNOSTIC FINDINGS:  2020:  Normal MRI appearance of the lumbar spine.   PATIENT SURVEYS:  FOTO 60% [place]  scale given to patient for home  [place] Scale Lumbar (_0/1) Knees (_0/2) Elbows (2_/2)  5th digit (_0/2) Thumb (_0/2) 2/9 Comment on hips, shoulders: Shoulders very flexible, can reach behind back and clasp hands in the middle Rt hip subluxes volitionally     COGNITION:           Overall cognitive status: Within functional limits for tasks assessed  SENSATION: Reports numbness and tingling in both feet intermittently    MUSCLE LENGTH: Hamstrings: Right 40 deg; Left 42 deg Thomas test: Right NT deg; Left NT deg   POSTURE: No Significant postural limitations   PALPATION: TTP lateral Rt hip, NT other than that  Mild hypermobility noted in thoracolumbar spine, no pain    LUMBAR ROM:    Active  A/PROM  eval  Flexion 4 inches from floor  Extension WNL/hyper   Right lateral flexion WNL   Left lateral flexion WNL   Right rotation WNL  Left rotation WNL  Patient moves quickly and with ease    LOWER EXTREMITY ROM:      Passive   Right eval  Left eval  Hip flexion WNL WNL  Hip extension WNL WNL  Hip abduction      Hip adduction      Hip internal rotation WNL, 50+ deg WNL 50+  Hip external rotation WNL 45-50 WNL 45-50  Knee flexion WNL  WNL  Knee extension 0 0  Ankle dorsiflexion      Ankle plantarflexion      Ankle inversion      Ankle eversion       (Blank rows = not tested)   LOWER EXTREMITY MMT:     MMT Right eval Left eval  Hip flexion 5 5  Hip extension 5 5  Hip abduction 4 4+  Hip adduction      Hip internal rotation      Hip external rotation      Knee flexion 5 5  Knee extension 5 5  Ankle dorsiflexion      Ankle plantarflexion      Ankle inversion      Ankle eversion       (Blank rows = not tested)       UPPER EXTREMITY ROM:             WFL throughout UEs    UPPER EXTREMITY MMT:   MMT Right 01/24/2022 Left 01/24/2022  Shoulder flexion 4+ 4+  Shoulder extension      Shoulder abduction 4+ 4+  Shoulder adduction      Shoulder internal rotation      Shoulder external rotation      Middle trapezius 4- 4-  Lower trapezius      Elbow flexion 5 5  Elbow extension 4 4  Wrist flexion      Wrist extension      Wrist ulnar deviation      Wrist radial deviation      Wrist pronation      Wrist supination      Grip strength (lbs)      (Blank rows = not tested)   JOINT MOBILITY TESTING:  NT   PALPATION:  NT   FUNCTIONAL TESTS:  2 minute walk test: 684 feet   GAIT: Distance walked: 684 Assistive device utilized: None Level of assistance: Complete [place] Comments: face pace, normal stride       TODAY'S TREATMENT   PT eval, including assessment of overall strength, joint mobility. 2 min walk. Establish POC>    [facility] Adult PT Treatment:                                                DATE: 02/17/22  Baseline HR 80, SaO2 99%, BP 111/88  Therapeutic Exercise:  TM walking 4 min 3.0 mph , 3% grade added speed to jog 5.2 mph for 5 min (BP 163/107, HR 138 , SaO2) KB DL 25 lbs x  15, then bar 45 lbs x 15  Glute step up, 10 lbs 1 UE assisting for stability through knee  x 15, slightly dizzy Banded glute bridge x 15 added clam x 15  Quadruped hip extension blue band x 15  Prone hip extension bent knee x 15 with band  Sideplank hip lifts x 10 added clam blue band x 10  Upper abdominal curl x 15 used variations to challenge   Self Care: Take aways for the exercise (in gym and post PT) Warm up/mobility/activation drills (glutes in particular) Lifting to stress/strain the muscles NOT the nervous system Overhead arms increases cardiac strain, demand  [facility] Adult PT Treatment:                                                DATE: 02/10/22 Therapeutic Exercise:  HR MAX 187   TARGET  HR 112-150 (60-80%)  T.M (15 minutes total) : Grade 3, 3.3 mph warm up 3.5 minutes, grade 10 x 2 min HR 144, 2 minutes at warm up speed (HR down to 133) , 2 min grade 10 HR 151, reduce to warm up speed for 2 minutes HR down to 131. BP after 193/63, 115 HR Runners step up with OH press 8# alternating x 10 Starting HR 102 : Runners step up 8 inch with OH press 8# alternating x 10: HR 126; 166/98 BP (2.5 min to reduce HR to under 100 bpm) Runners step up with OH press 8# alternating x 15 191/86; HR 141 ( 4 min min rest to reduce HR to under 100) Runners step up with OH press 8# alternating x 15  148/101 HR 156-min dizziness (5 min rest to reduce HR to under 100)  20 sec 15# KB swing, rest 1 minute , repeat for 20 sec , HR 150, 129/100 -min dizziness (6 minutes to return HR to less than 100)   [facility] Adult PT Treatment:   Initial BP 132/86, HR 77                                   DATE: 02/08/22 Therapeutic Exercise: Rec bike x 5 minutes  [facility] Press 10# bilat  Bent over row - cues for position- cues for pace  Bent over tricep press 9# bilat Cable Lat Pull 20# Row 20#  Runners Step ups 10# in hands- x 10 each alternating BP 159/77, HR 110 Runners step up with OH press 8# alternating x 10  BP  153/100, HR 136 Runners Step up with OH press 10# alternating x 12  BP 148/101, HR 135      [facility] Adult PT Treatment:     initial BP 138/88, HR 80 bpm                                           DATE: 02/08/22 Therapeutic Exercise: Rec Bike L2 x 6 min HR 112 bpm Ball squeeze with draw ins  [facility] with partial bridge  Isometric Dead Bug 30  sec x 2 Dead Bug with Leg ext 10 x 2 - cues for neutral spine Plank on hands 45 sec Forearm plank 45 sec  Side clam blue band 10 x 2 each Hex bar lifting 55# 10 x 3, HR 118, BP 144/84, RPE 14- min cues for chin tuck otherwise good form [place] split squat 15# 10 x 2 each HR 127, BP 178/100 , RPE 15-16 -cues to avoid knee past toe, cues to slow down, stabilize as needed with UE- has knee pain in back knee   [facility] Adult PT Treatment:     initial BP 138/88, HR 80 bpm                                           DATE: 02/03/22 Therapeutic Exercise: Rec Bike L2 x 6 min HR 112 bpm Ball squeeze with draw ins  [facility] with partial bridge  Isometric Dead Bug 30 sec x 2 Dead Bug with Leg ext 10 x 2 - cues for neutral spine Plank on hands 45 sec Forearm plank 45 sec  Side clam blue band 10 x 2 each Hex bar lifting 55# 10 x 3, HR 118, BP 144/84, RPE 14- min cues for chin tuck otherwise good form [place] split squat 15# 10 x 2 each HR 127, BP 178/100 , RPE 15-16 -cues to avoid knee past toe, cues to slow down, stabilize as needed with UE- has knee pain in back knee   [facility] Adult PT Treatment:                                                DATE: 02/01/22 Therapeutic Exercise:  Recumbent bike 6 min L 2 , RPE 02-26-10  154/88, HR 115  Breathing supine with added ball squeeze  Neutral spine A/P  March, knee extension, SLR x 10 each  Partial bridge with ball  x 10  Blue band bent knee fall out x 10  Wall sit/slides , used ball at mid back Reverse slider lunge (glute) x 10 at wall  Self Care: Normal HR response and BO response to  exercise Dizziness Holding on for balance and to counteract forces through knee Neutral spine and core     RHR 86 RBP 128/91    Orthostatics 100 128/90  Exercise - 30 sec step  159/101 HR 135  90 sec for HR return to baseline  after 3 min rest  142/93  End of session: 131/93  PATIENT EDUCATION:  Education details: POC, joint mobility, neutral spine  Person educated: Patient Education method: Customer service manager Education comprehension: verbalized understanding     HOME EXERCISE PROGRAM: Access Code: [email or web address] URL: [email or web address] Date: 02/01/2022 Prepared by: Raeford Razor  Exercises - Isometric Dead Bug  - 1 x daily - 7 x weekly - 1 sets - 5 reps - 30 hold - Clamshell with Resistance  - 1 x daily - 7 x weekly - 2 sets - 10 reps - 5 hold - Supine Dead Bug with Leg Extension  - 1 x daily - 7 x weekly - 2 sets - 10 reps - 30 hold - Standard Plank  - 1 x daily - 7 x weekly - 1 sets - 3-5 reps - 30-60 hold - Wall  Squat  - 1 x daily - 7 x weekly - 2 sets - 10 reps - 10-30 holdNone yet    ASSESSMENT:   CLINICAL IMPRESSION: Patient able to jog for 5 min , DBP elevated post.  She was only slightly dizzy, otherwise felt good during the run.  Reviewed dead lifting form and did well with 45 lbs BUT she has difficulty feeling glute activation.  Recommend she do some banded glute exercises prior to more demanding loads.  She will benefit from a structured plan for safe exercises that she can do that do not strain her to the point of elevating BP.     OBJECTIVE IMPAIRMENTS cardiopulmonary status limiting activity, difficulty walking, decreased strength, dizziness, increased fascial restrictions, pain, and joint hypermobility  .    ACTIVITY LIMITATIONS carrying, lifting, bending, sitting, standing, squatting, and locomotion level   PARTICIPATION LIMITATIONS: community activity, occupation, and fitness   PERSONAL FACTORS Time since onset of  injury/illness/exacerbation and 1-2 comorbidities: multi-system , multijoint involvement  are also affecting patient's functional outcome.    REHAB POTENTIAL: Excellent   CLINICAL DECISION MAKING: Evolving/moderate complexity   EVALUATION COMPLEXITY: Moderate     GOALS: Goals reviewed with patient? No   SHORT TERM GOALS: Target date: 02/14/2022   Pt will be I with HEP for spinal stabilization, initial  Baseline: Goal status: MET   2.  Pt will be able to show good body mechanics for lifting, hinging no wgt Baseline:  Goal status: MET   3.  Pt will have baseline measures for cardiorespiratory endurance and goal set  Baseline:  Goal status: PARTIALLY MET     LONG TERM GOALS: Target date: 03/07/2022   Pt will be able to report consistent HEP at home with no increased in pain Baseline:  Goal status: INITIAL   2.  Pt will be able to demo core good strength and stability in neutral spine with intermediate level exercises Baseline:  Goal status: INITIAL   3.  Pt will be able to perform lifting in gym/home with good control and knowledge of joint position, no increase in joint pain, including squats  Baseline:  Goal status: INITIAL   4.  Pt will be able to run for 10 min at 5.0 mph without excessive dizziness, fatigue.  Baseline:  Goal status: NEW   5. Pt will be able to report mild increase in fatigue following extended periods of standing , walking in the community (> 2 hours) Baseline:  Goal status: INITIAL     PLAN: PT FREQUENCY: 1-2x/week   PT DURATION: 6 weeks   PLANNED INTERVENTIONS: Therapeutic exercises, Therapeutic activity, Neuromuscular re-education, Balance training, Gait training, Patient/Family education, Self Care, Dry Needling, Cryotherapy, Taping, Manual therapy, Re-evaluation, and Pilates  .   PLAN FOR NEXT SESSION: Reassess.  MD recommendations.   endurance- monitor BP. Check in with RPE scale, aim for 11-13 . DID YOU TRY EVLO app?  Try dead lift,  step ups/down, leg press , conditioning, check LTGS. FOTO?     Raeford Razor, PT 02/17/22 10:07 AM Phone: [phone] Fax: [phone]

## 2022-02-22 ENCOUNTER — Encounter: Payer: 59 | Admitting: Physical Therapy

## 2022-02-23 ENCOUNTER — Ambulatory Visit: Payer: 59 | Attending: Interventional Cardiology | Admitting: Internal Medicine

## 2022-02-23 VITALS — BP 110/78 | HR 86 | Ht 65.0 in | Wt 130.0 lb

## 2022-02-23 DIAGNOSIS — Q681 Congenital deformity of finger(s) and hand: Secondary | ICD-10-CM

## 2022-02-23 DIAGNOSIS — I951 Orthostatic hypotension: Secondary | ICD-10-CM | POA: Diagnosis not present

## 2022-02-23 DIAGNOSIS — M2520 Flail joint, unspecified joint: Secondary | ICD-10-CM

## 2022-02-23 DIAGNOSIS — R6889 Other general symptoms and signs: Secondary | ICD-10-CM

## 2022-02-23 NOTE — Therapy (Signed)
OUTPATIENT PHYSICAL THERAPY TREATMENT NOTE DISCHARGE   Patient Name: Dawn [name] MRN: [id number] DOB:Aug 22, 1990, 31 y.o., female Today's Date: 02/24/2022  PCP: none REFERRING PROVIDER: Dr. Virl Axe   END OF SESSION:   PT End of Session - 02/24/22 0813     Visit Number 7    Number of Visits 12    Date for PT Re-Evaluation 03/07/22    Authorization Type [employer]    PT Start Time 0804    PT Stop Time 0850    PT Time Calculation (min) 46 min    Activity Tolerance Patient tolerated treatment well    Behavior During Therapy [facility] for tasks assessed/performed               Past Medical History:  Diagnosis Date   Breast mass 07/25/2016   Pain with lump   Family history of breast cancer    Family history of colon cancer    Family history of esophageal cancer    Family history of gene mutation    Family history of melanoma    Fracture of left foot 2007   treated with brace   Hx of degenerative disc disease    Migraine    with aura   Monoallelic mutation of CHEK2 gene in female patient    Oligomenorrhea    better on OCP   Sjogren's syndrome ([place]) 2018   Tendonitis    Past Surgical History:  Procedure Laterality Date   Cervical disk replacement  01/2021   fishbone removal     pt swallowed fishbone & had to be surgically removed   [place] CYST  08/17/2012   right shoulder area   [place] EXTRACTION     Patient Active Problem List   Diagnosis Date Noted   Orthostatic intolerance 02/23/2022   Palpitations 01/12/2022   Allergic contact dermatitis 94/12/6806   Monoallelic mutation of CHEK2 gene in female patient 12/30/2020   Genetic testing 12/29/2020   Family history of gene mutation 12/14/2020   Family history of breast cancer 12/14/2020   Family history of melanoma 12/14/2020   Family history of colon cancer 12/14/2020   Family history of esophageal cancer 12/14/2020   Recurrent epistaxis 07/27/2020   Degenerative  disc disease, cervical 12/06/2018   Dysphagia, idiopathic 07/13/2017   Gastroesophageal reflux disease without esophagitis 07/13/2017   History of Sjogren's disease ([place]) 07/13/2017    REFERRING DIAG: exercise intolerance, hypermobile   THERAPY DIAG:  Hypermobility syndrome  Polyarthralgia  Exercise intolerance  Rationale for Evaluation and Treatment Rehabilitation  PERTINENT HISTORY: see above   PRECAUTIONS: monitor BP and HRR   SUBJECTIVE: Had a video visit with Dr. Caryl Comes .  Ordered a treadmill exercise test. Did some deadlifting at the gym went I bend over it hurts 6/10.   PAIN:   Are you having pain? Yes: NPRS scale: 6/10 Pain location: variable Pain description: variable  Aggravating factors: sitting still Relieving factors: exercise usually    OBJECTIVE: (objective measures completed at initial evaluation unless otherwise dated) DIAGNOSTIC FINDINGS:  2020:  Normal MRI appearance of the lumbar spine.   PATIENT SURVEYS:  EVAL FOTO 60%  DISCHARGE FOTO 65%  [place]  scale given to patient for home  Beighton Scale Lumbar (_0/1) Knees (_0/2) Elbows (2_/2)  5th digit (_0/2) Thumb (_0/2) 2/9 Comment on hips, shoulders: Shoulders very flexible, can reach behind back and clasp hands in the middle Rt hip subluxes volitionally     COGNITION:  Overall cognitive status: Within functional limits for tasks assessed                          SENSATION: Reports numbness and tingling in both feet intermittently    MUSCLE LENGTH: Hamstrings: Right 40 deg; Left 42 deg Thomas test: Right NT deg; Left NT deg   POSTURE: No Significant postural limitations   PALPATION: TTP lateral Rt hip, NT other than that  Mild hypermobility noted in thoracolumbar spine, no pain    LUMBAR ROM:    Active  A/PROM  eval  Flexion 4 inches from floor  Extension WNL/hyper   Right lateral flexion WNL   Left lateral flexion WNL   Right rotation WNL  Left rotation WNL   Patient moves quickly and with ease    LOWER EXTREMITY ROM:      Passive   Right eval Left eval  Hip flexion WNL WNL  Hip extension WNL WNL  Hip abduction      Hip adduction      Hip internal rotation WNL, 50+ deg WNL 50+  Hip external rotation WNL 45-50 WNL 45-50  Knee flexion WNL  WNL  Knee extension 0 0  Ankle dorsiflexion      Ankle plantarflexion      Ankle inversion      Ankle eversion       (Blank rows = not tested)   LOWER EXTREMITY MMT:     MMT Right eval Left eval  Hip flexion 5 5  Hip extension 5 5  Hip abduction 4 4+  Hip adduction      Hip internal rotation      Hip external rotation      Knee flexion 5 5  Knee extension 5 5  Ankle dorsiflexion      Ankle plantarflexion      Ankle inversion      Ankle eversion       (Blank rows = not tested)       UPPER EXTREMITY ROM:             WFL throughout UEs    UPPER EXTREMITY MMT:   MMT Right 01/24/2022 Left 01/24/2022  Shoulder flexion 4+ 4+  Shoulder extension      Shoulder abduction 4+ 4+  Shoulder adduction      Shoulder internal rotation      Shoulder external rotation      Middle trapezius 4- 4-  Lower trapezius      Elbow flexion 5 5  Elbow extension 4 4  Wrist flexion      Wrist extension      Wrist ulnar deviation      Wrist radial deviation      Wrist pronation      Wrist supination      Grip strength (lbs)      (Blank rows = not tested)   JOINT MOBILITY TESTING:  NT   PALPATION:  NT   FUNCTIONAL TESTS:  2 minute walk test: 684 feet   GAIT: Distance walked: 684 Assistive device utilized: None Level of assistance: Complete [place] Comments: face pace, normal stride       TODAY'S TREATMENT   PT eval, including assessment of overall strength, joint mobility. 2 min walk. Establish POC>   [facility] Adult PT Treatment:  DATE: 02/24/22  Compared BP readings at rest.   RBP 130/96 (personal) and 123/90  (clinic) 147/92  Therapeutic Exercise: Treadmill up to 6.0 mph , 3 % incline max for total of  Mat core review and concepts Dead bug variations with 10 lbs at chest: reverse toe tap, knee extension and unilateral anti-rotation Bridge with blue band and progression to bent knee fall out, march  Modified march to on the mat with band  Sidelying clam blue band x 15  Sidelying hip abduction x  10  Dead lift variations: Review hip hinge with dowel - no pain  Single leg RDL with 10 lbs DB each LE x 10 Blue band for double leg RDL x 10  Self Care: Decrease ROM of RDL  Ore routing DC, Physiological scientist  BP end of session 124/95 with HR 95          [facility] Adult PT Treatment:                                                DATE: 02/17/22  Baseline HR 80, SaO2 99%, BP 111/88  Therapeutic Exercise: TM walking 4 min 3.0 mph , 3% grade added speed to jog 5.2 mph for 5 min (BP 163/107, HR 138 , SaO2) KB DL 25 lbs x 15, then bar 45 lbs x 15  Glute step up, 10 lbs 1 UE assisting for stability through knee  x 15, slightly dizzy Banded glute bridge x 15 added clam x 15  Quadruped hip extension blue band x 15  Prone hip extension bent knee x 15 with band  Sideplank hip lifts x 10 added clam blue band x 10  Upper abdominal curl x 15 used variations to challenge   Self Care: Take aways for the exercise (in gym and post PT) Warm up/mobility/activation drills (glutes in particular) Lifting to stress/strain the muscles NOT the nervous system Overhead arms increases cardiac strain, demand  [facility] Adult PT Treatment:                                                DATE: 02/10/22 Therapeutic Exercise:  HR MAX 187   TARGET  HR 112-150 (60-80%)  T.M (15 minutes total) : Grade 3, 3.3 mph warm up 3.5 minutes, grade 10 x 2 min HR 144, 2 minutes at warm up speed (HR down to 133) , 2 min grade 10 HR 151, reduce to warm up speed for 2 minutes HR down to 131. BP after 193/63, 115 HR Runners step up with OH press  8# alternating x 10 Starting HR 102 : Runners step up 8 inch with OH press 8# alternating x 10: HR 126; 166/98 BP (2.5 min to reduce HR to under 100 bpm) Runners step up with OH press 8# alternating x 15 191/86; HR 141 ( 4 min min rest to reduce HR to under 100) Runners step up with OH press 8# alternating x 15  148/101 HR 156-min dizziness (5 min rest to reduce HR to under 100)  20 sec 15# KB swing, rest 1 minute , repeat for 20 sec , HR 150, 129/100 -min dizziness (6 minutes to return HR to less than 100)   [facility] Adult PT  Treatment:   Initial BP 132/86, HR 77                                   DATE: 02/08/22 Therapeutic Exercise: Rec bike x 5 minutes  Curl to [facility] Press 10# bilat  Bent over row - cues for position- cues for pace  Bent over tricep press 9# bilat Cable Lat Pull 20# Row 20#  Runners Step ups 10# in hands- x 10 each alternating BP 159/77, HR 110 Runners step up with OH press 8# alternating x 10  BP 153/100, HR 136 Runners Step up with OH press 10# alternating x 12  BP 148/101, HR 135      [facility] Adult PT Treatment:     initial BP 138/88, HR 80 bpm                                           DATE: 02/08/22 Therapeutic Exercise: Rec Bike L2 x 6 min HR 112 bpm Ball squeeze with draw ins  [facility] with partial bridge  Isometric Dead Bug 30 sec x 2 Dead Bug with Leg ext 10 x 2 - cues for neutral spine Plank on hands 45 sec Forearm plank 45 sec  Side clam blue band 10 x 2 each Hex bar lifting 55# 10 x 3, HR 118, BP 144/84, RPE 14- min cues for chin tuck otherwise good form [place] split squat 15# 10 x 2 each HR 127, BP 178/100 , RPE 15-16 -cues to avoid knee past toe, cues to slow down, stabilize as needed with UE- has knee pain in back knee   [facility] Adult PT Treatment:     initial BP 138/88, HR 80 bpm                                           DATE: 02/03/22 Therapeutic Exercise: Rec Bike L2 x 6 min HR 112 bpm Ball squeeze with draw ins  [facility] with partial bridge   Isometric Dead Bug 30 sec x 2 Dead Bug with Leg ext 10 x 2 - cues for neutral spine Plank on hands 45 sec Forearm plank 45 sec  Side clam blue band 10 x 2 each Hex bar lifting 55# 10 x 3, HR 118, BP 144/84, RPE 14- min cues for chin tuck otherwise good form [place] split squat 15# 10 x 2 each HR 127, BP 178/100 , RPE 15-16 -cues to avoid knee past toe, cues to slow down, stabilize as needed with UE- has knee pain in back knee   [facility] Adult PT Treatment:                                                DATE: 02/01/22 Therapeutic Exercise:  Recumbent bike 6 min L 2 , RPE 02-26-10  154/88, HR 115  Breathing supine with added ball squeeze  Neutral spine A/P  March, knee extension, SLR x 10 each  Partial bridge with ball  x 10  Blue band bent knee fall out x 10  Wall sit/slides , used ball  at mid back Reverse slider lunge (glute) x 10 at wall  Self Care: Normal HR response and BO response to exercise Dizziness Holding on for balance and to counteract forces through knee Neutral spine and core     RHR 86 RBP 128/91    Orthostatics 100 128/90  Exercise - 30 sec step  159/101 HR 135  90 sec for HR return to baseline  after 3 min rest  142/93  End of session: 131/93  PATIENT EDUCATION:  Education details: POC, joint mobility, neutral spine  Person educated: Patient Education method: Customer service manager Education comprehension: verbalized understanding     HOME EXERCISE PROGRAM:  Access Code: [email or web address] URL: [email or web address] Date: 02/24/2022 Prepared by: Raeford Razor  Exercises - Isometric Dead Bug  - 1 x daily - 7 x weekly - 1 sets - 5 reps - 30 hold - Clamshell with Resistance  - 1 x daily - 7 x weekly - 2 sets - 10 reps - 5 hold - Supine Dead Bug with Leg Extension  - 1 x daily - 7 x weekly - 2 sets - 10 reps - 30 hold - Standard Plank  - 1 x daily - 7 x weekly - 1 sets - 3-5 reps - 30-60 hold - Wall Squat  - 1 x daily - 7 x weekly -  2 sets - 10 reps - 10-30 hold - Sidelying Hip Abduction with Resistance at Thighs  - 1 x daily - 7 x weekly - 2 sets - 10 reps - 5 hold - Deadlift with Resistance  - 1 x daily - 7 x weekly - 2 sets - 10 reps - 5 hold  ASSESSMENT:   CLINICAL IMPRESSION: Patient brought in her cuff to compare readings it was accurate.  Her BP at the gym was within a good limit, DPP in high 80's mostly.  She has elevated BP related to exercise and when nervous in the clinic. She plans to monitor her BP and sx in the gym.  She has an appt for a personal trainer next week.  She has a good understanding of concepts related to hypermobility and is very strong, fit overall. She had moderate local pain centrally in lumbar spine from deadlifting.  She reports she may have gone too low into her mid shin.  Given alternatives for this .   FOTO is improved 5%. She is DC from therapy as she can do her HEP and modify in the gym.  Having GXT that will help guide next steps regarding BP.    OBJECTIVE IMPAIRMENTS cardiopulmonary status limiting activity, difficulty walking, decreased strength, dizziness, increased fascial restrictions, pain, and joint hypermobility  .    ACTIVITY LIMITATIONS carrying, lifting, bending, sitting, standing, squatting, and locomotion level   PARTICIPATION LIMITATIONS: community activity, occupation, and fitness   PERSONAL FACTORS Time since onset of injury/illness/exacerbation and 1-2 comorbidities: multi-system , multijoint involvement  are also affecting patient's functional outcome.    REHAB POTENTIAL: Excellent   CLINICAL DECISION MAKING: Evolving/moderate complexity   EVALUATION COMPLEXITY: Moderate     GOALS: Goals reviewed with patient? No   SHORT TERM GOALS: Target date: 02/14/2022   Pt will be I with HEP for spinal stabilization, initial  Baseline: Goal status: MET   2.  Pt will be able to show good body mechanics for lifting, hinging no wgt Baseline:  Goal status: MET   3.   Pt will have baseline measures for cardiorespiratory endurance and goal set  Baseline:  Goal  status: PARTIALLY MET     LONG TERM GOALS: Target date: 03/07/2022   Pt will be able to report consistent HEP at home with no increased in pain Baseline:  Goal status: MET   2.  Pt will be able to demo core good strength and stability in neutral spine with intermediate level exercises Baseline:  Goal status:MET   3.  Pt will be able to perform lifting in gym/home with good control and knowledge of joint position, no increase in joint pain, including squats  Baseline:  Goal status: Partially met (deadlift)   4.  Pt will be able to run for 10 min at 5.0 mph without excessive dizziness, fatigue.  Baseline:  Goal status:MET   5. Pt will be able to report mild increase in fatigue following extended periods of standing , walking in the community (> 2 hours) Baseline:  Goal status: MET      PLAN: PT FREQUENCY: 1-2x/week   PT DURATION: 6 weeks   PLANNED INTERVENTIONS: Therapeutic exercises, Therapeutic activity, Neuromuscular re-education, Balance training, Gait training, Patient/Family education, Self Care, Dry Needling, Cryotherapy, Taping, Manual therapy, Re-evaluation, and Pilates  .   PLAN FOR NEXT SESSION: DC   Raeford Razor, PT 02/24/22 9:05 AM Phone: [phone] Fax: [phone]   PHYSICAL THERAPY DISCHARGE SUMMARY  Visits from Start of Care: 7  Current functional level related to goals / functional outcomes: See above    Remaining deficits: Cont to have general hypermobility but with good strength and knowledge of condition and limitations.   Elevated BP.   Education / Equipment: HEP , core/stability, form with lifting, hypermobility of joints   Patient agrees to discharge. Patient goals were met. Patient is being discharged due to maximized rehab potential.     Raeford Razor, PT 02/24/22 9:17 AM Phone: [phone] Fax: [phone]

## 2022-02-23 NOTE — Progress Notes (Signed)
Electrophysiology TeleHealth Note      Date:  02/23/2022   ID:  Dawn [name], DOB 08-11-1990, MRN [id number]  Location: patient's home  Provider location: [address] [place]  Evaluation Performed: Follow-up visit  PCP:  Pcp, No  Cardiologist:     Electrophysiologist:  SK   Chief Complaint:     History of Present Illness:    Dawn [name] is a 31 y.o. female who presents via audio/video conferencing for a telehealth visit today.  Since last being seen in our clinic for orthostatic intolerance with evidence of orthostatic hypotension in the context of arachnodactyly and joint hyper mobility the patient reports she has not yet started salt supplementation. And still struggling with heat intolerance and orthostatic lightheadedness     The patient denies symptoms of fevers, chills, cough, or new SOB worrisome for COVID 19.    Past Medical History:  Diagnosis Date   Breast mass 07/25/2016   Pain with lump   Family history of breast cancer    Family history of colon cancer    Family history of esophageal cancer    Family history of gene mutation    Family history of melanoma    Fracture of left foot 2007   treated with brace   Hx of degenerative disc disease    Migraine    with aura   Monoallelic mutation of CHEK2 gene in female patient    Oligomenorrhea    better on OCP   Sjogren's syndrome ([place]) 2018   Tendonitis     Past Surgical History:  Procedure Laterality Date   Cervical disk replacement  01/2021   fishbone removal     pt swallowed fishbone & had to be surgically removed   IRRIGATION AND DEBRIDEMENT SEBACEOUS CYST  08/17/2012   right shoulder area   WISDOM TOOTH EXTRACTION      Current Outpatient Medications  Medication Sig Dispense Refill   Cholecalciferol 125 MCG (5000 UT) capsule Take 5,000 Units by mouth daily.     Ibuprofen (ADVIL PO) Take by mouth as needed.     L-Theanine 200 MG CAPS Take 200 mg by mouth at bedtime.     Magnesium  Oxide 240 MG PACK Take by mouth. 2 CAPSULE DAILY     MELATONIN PO Take by mouth. 5 mg at bedtime     norethindrone (MICRONOR) 0.35 MG tablet Take 1 tablet (0.35 mg total) by mouth daily. 84 tablet 3   Omega-3 Fatty Acids (FISH OIL) 1200 MG CAPS Take by mouth. 1 CAPSULE DAILY     OVER THE COUNTER MEDICATION COLLAGEN, MOVE FREE BRAND 1 PER DAY     No current facility-administered medications for this visit.    Allergies:   Amoxicillin, Ceclor [cefaclor], Penicillins, and Prednisone    ROS:  Please see the history of present illness.   All other systems are personally reviewed and negative.    Exam:    Vital Signs:  BP 110/78   Pulse 86   Ht $R'5\' 5"'aV$  (1.651 m)   Wt 130 lb (59 kg)   LMP 02/02/2022   BMI 21.63 kg/m         Labs/Other Tests and Data Reviewed:    Recent Labs: 03/17/2021: ALT 18; BUN 9; Creat 0.52; Hemoglobin 14.1; Platelets 257; Potassium 4.2; Sodium 138   Wt Readings from Last 3 Encounters:  02/23/22 130 lb (59 kg)  01/18/22 130 lb 8 oz (59.2 kg)  01/16/22 130 lb (59 kg)  Other studies personally reviewed: A     ASSESSMENT & PLAN:    Orthostatic intolerance with orthostatic hypertension evident today   Exercise intolerance with tachycardia   Hypotension at home blood pressure 90s-100s   Arachnodactyly   Joint laxity   Sjogrens  Will increase salt concentration.  We will plan to undertake treadmill testing to look at exercise-induced hypertension as suggested by her PCP    Follow-up:  GXT    Current medicines are reviewed at length with the patient today.   The patient has concerns regarding her medicines.  The following changes were made today:  none  Labs/ tests ordered today include: GXT ( w me)  Orders Placed This Encounter  Procedures   EXERCISE TOLERANCE TEST (ETT)      Today, I have spent 21 minutes with the patient with telehealth technology discussing the above.  Signed, Virl Axe, MD  02/23/2022 6:10 PM     [place] [address] [phone] (office) [phone] (fax)

## 2022-02-23 NOTE — Patient Instructions (Addendum)
Medication Instructions:  Your physician recommends that you continue on your current medications as directed. Please refer to the Current Medication list given to you today.  *If you need a refill on your cardiac medications before your next appointment, please call your pharmacy*   Lab Work: None ordered.  If you have labs (blood work) drawn today and your tests are completely normal, you will receive your results only by: MyChart Message (if you have MyChart) OR A paper copy in the mail If you have any lab test that is abnormal or we need to change your treatment, we will call you to review the results.   Testing/Procedures: Someone will contact you to schedule. Your physician has requested that you have an exercise tolerance test. For further information please visit [email or web address] Please also follow instruction sheet, as given.    Follow-Up: At [facility], you and your health needs are our priority.  As part of our continuing mission to provide you with exceptional heart care, we have created designated Provider Care Teams.  These Care Teams include your primary Cardiologist (physician) and Advanced Practice Providers (APPs -  Physician Assistants and Nurse Practitioners) who all work together to provide you with the care you need, when you need it.  We recommend signing up for the patient portal called "MyChart".  Sign up information is provided on this After Visit Summary.  MyChart is used to connect with patients for Virtual Visits (Telemedicine).  Patients are able to view lab/test results, encounter notes, upcoming appointments, etc.  Non-urgent messages can be sent to your provider as well.   To learn more about what you can do with MyChart, go to [email or web address].    Your next appointment:  To be determined   Important Information About Sugar

## 2022-02-24 ENCOUNTER — Ambulatory Visit: Payer: 59 | Admitting: Physical Therapy

## 2022-02-24 ENCOUNTER — Encounter: Payer: Self-pay | Admitting: Internal Medicine

## 2022-02-24 ENCOUNTER — Encounter: Payer: Self-pay | Admitting: Physical Therapy

## 2022-02-24 DIAGNOSIS — M2550 Pain in unspecified joint: Secondary | ICD-10-CM

## 2022-02-24 DIAGNOSIS — R6889 Other general symptoms and signs: Secondary | ICD-10-CM

## 2022-02-24 DIAGNOSIS — M357 Hypermobility syndrome: Secondary | ICD-10-CM

## 2022-03-09 ENCOUNTER — Telehealth: Payer: Self-pay | Admitting: Internal Medicine

## 2022-03-09 NOTE — Telephone Encounter (Signed)
FYI  patient was schedule for gxt  -cancel test due to she couldn't afford test.   Order wil be cancel.

## 2022-03-23 ENCOUNTER — Ambulatory Visit: Payer: 59 | Admitting: Internal Medicine

## 2022-08-01 ENCOUNTER — Encounter: Payer: Self-pay | Admitting: Internal Medicine

## 2022-10-02 ENCOUNTER — Encounter: Payer: Self-pay | Admitting: Internal Medicine

## 2022-10-02 ENCOUNTER — Other Ambulatory Visit (INDEPENDENT_AMBULATORY_CARE_PROVIDER_SITE_OTHER): Payer: 59

## 2022-10-02 ENCOUNTER — Ambulatory Visit: Payer: 59 | Admitting: Internal Medicine

## 2022-10-02 VITALS — BP 118/80 | HR 89 | Ht 65.0 in | Wt 130.0 lb

## 2022-10-02 DIAGNOSIS — R140 Abdominal distension (gaseous): Secondary | ICD-10-CM

## 2022-10-02 DIAGNOSIS — Z1501 Genetic susceptibility to malignant neoplasm of breast: Secondary | ICD-10-CM

## 2022-10-02 DIAGNOSIS — R109 Unspecified abdominal pain: Secondary | ICD-10-CM | POA: Diagnosis not present

## 2022-10-02 DIAGNOSIS — Z1509 Genetic susceptibility to other malignant neoplasm: Secondary | ICD-10-CM

## 2022-10-02 DIAGNOSIS — Z1502 Genetic susceptibility to malignant neoplasm of ovary: Secondary | ICD-10-CM

## 2022-10-02 DIAGNOSIS — K5900 Constipation, unspecified: Secondary | ICD-10-CM | POA: Diagnosis not present

## 2022-10-02 DIAGNOSIS — Z1589 Genetic susceptibility to other disease: Secondary | ICD-10-CM

## 2022-10-02 LAB — HIGH SENSITIVITY CRP: CRP, High Sensitivity: 2.16 mg/L (ref 0.000–5.000)

## 2022-10-02 LAB — TSH: TSH: 2.27 u[IU]/mL (ref 0.35–5.50)

## 2022-10-02 NOTE — Progress Notes (Signed)
Chief Complaint: Bloating, constipation  HPI : 32 year old female with history of Sjogren's syndrome, migraine, and CHEK2 mutation presents with bloating and constipation  She has had constipation since she was a child, which has persisted to adulthood. She has learned to manage the constipation better over time. If she takes her supplement regimen, she will have one BM every day. If she does not do her regimen, she will have one BM every 1-2 days with only small amounts of stools. Her regimen is magnesium glycinate (six pills per day). She will also use collagen and fish oil. She is vegan because she has noticed that meat and dairy seem to make her GI symptoms worse. She hydrates well and walks regularly. Denies blood in stools. Weight has been stable. Denies N&V. She has a little bit of abdominal pain in the RUQ. The ab pain tends to get better after she has a BM. She used to have acid reflux issues, but this has improved after cutting out meat. Her bloating is better when the constipation is well controlled. Abdominal massage seems to help the pain. Grandfather had colon cancer (he chewed tobacco). Uncle had esophageal cancer (he also chewed tabacco). She tends to try avoid medications if possible. She tried psyllium husk but this seemed to make her GI symptoms worse  Past Medical History:  Diagnosis Date   Anxiety    Breast mass 07/25/2016   Pain with lump   Family history of breast cancer    Family history of colon cancer    Family history of esophageal cancer    Family history of gene mutation    Family history of melanoma    Fracture of left foot 2007   treated with brace   Hx of degenerative disc disease    Migraine    with aura   Monoallelic mutation of CHEK2 gene in female patient    Oligomenorrhea    better on OCP   Sjogren's syndrome 2018   Tendonitis    Past Surgical History:  Procedure Laterality Date   Cervical disk replacement  01/2021   fishbone removal     pt  swallowed fishbone & had to be surgically removed   IRRIGATION AND DEBRIDEMENT SEBACEOUS CYST  08/17/2012   right shoulder area   WISDOM TOOTH EXTRACTION     Family History  Problem Relation Age of Onset   Hyperlipidemia Mother    Hypertension Mother    Other Mother        CHEK2 gene mutation [id number]   Melanoma Father 1       scalp   Breast cancer Maternal Grandmother 94   Parkinson's disease Paternal Grandmother    Colon cancer Paternal Grandfather 26       age 1 in 2018   Alzheimer's disease Paternal Grandfather        dx 30s   Breast cancer Maternal Aunt 73       bilateral, CHEK2 gene mutation positive   Esophageal cancer Paternal Uncle        hx smoking & chew tobacco   Liver disease Neg Hx    Social History   Tobacco Use   Smoking status: Never   Smokeless tobacco: Never  Vaping Use   Vaping Use: Never used  Substance Use Topics   Alcohol use: Yes    Alcohol/week: 2.0 standard drinks of alcohol    Types: 2 Glasses of wine per week   Drug use: No   Current Outpatient Medications  Medication Sig Dispense Refill   Cholecalciferol 125 MCG (5000 UT) capsule Take 5,000 Units by mouth daily.     Ibuprofen (ADVIL PO) Take by mouth as needed.     L-Theanine 200 MG CAPS Take 200 mg by mouth at bedtime.     magnesium gluconate (MAGONATE) 500 MG tablet Take 500 mg by mouth 2 (two) times daily.     MELATONIN PO Take by mouth. 5 mg at bedtime     norethindrone (MICRONOR) 0.35 MG tablet Take 1 tablet (0.35 mg total) by mouth daily. 84 tablet 3   Omega-3 Fatty Acids (FISH OIL) 1200 MG CAPS Take by mouth. 1 CAPSULE DAILY     OVER THE COUNTER MEDICATION COLLAGEN, MOVE FREE BRAND 1 PER DAY     Magnesium Oxide 240 MG PACK Take by mouth. 2 CAPSULE DAILY (Patient not taking: Reported on 10/02/2022)     No current facility-administered medications for this visit.   Allergies  Allergen Reactions   Amoxicillin    Ceclor [Cefaclor]    Penicillins    Prednisone Diarrhea and  Nausea And Vomiting   Review of Systems: All systems reviewed and negative except where noted in HPI.   Physical Exam: BP 118/80   Pulse 89   Ht  (1.651 m)   Wt 130 lb (59 kg)   BMI 21.63 kg/m  Constitutional: Pleasant,well-developed, female in no acute distress. HEENT: Normocephalic and atraumatic. Conjunctivae are normal. No scleral icterus. Cardiovascular: Normal rate, regular rhythm.  Pulmonary/chest: Effort normal and breath sounds normal. No wheezing, rales or rhonchi. Abdominal: Soft, nondistended, nontender. Bowel sounds active throughout. There are no masses palpable. No hepatomegaly. Extremities: No edema Neurological: Alert and oriented to person place and time. Skin: Skin is warm and dry. No rashes noted. Psychiatric: Normal mood and affect. Behavior is normal.  Labs 02/2021: CBC nml. CMP nml. Vit D low at 25.   TTE 01/25/22: 1. Left ventricular ejection fraction, by estimation, is 60 to 65%. The left ventricle has normal function. The left ventricle has no regional wall motion abnormalities. Left ventricular diastolic parameters were normal. The average left ventricular  global longitudinal strain is -24.2 %. The global longitudinal strain is normal.   2. Right ventricular systolic function is normal. The right ventricular size is normal. Tricuspid regurgitation signal is inadequate for assessing PA pressure.   3. The mitral valve is normal in structure. No evidence of mitral valve regurgitation. No evidence of mitral stenosis.   4. The aortic valve is tricuspid. Aortic valve regurgitation is not  visualized. No aortic stenosis is present.   5. The inferior vena cava is normal in size with greater than 50% respiratory variability, suggesting right atrial pressure of 3 mmHg.   ASSESSMENT AND PLAN: Constipation Bloating Abdominal pain CHEK2 monoallelic mutation Patient presents with longstanding constipation and bloating. She does have some ab pain that improves  after having a BM. She already follows a fairly restrictive vegan diet but is open to learning about any additional foods that could worsen her issues with bloating and abdominal pain. Currently her constipation is under good control on her regimen of supplements. She prefers to try natural remedies if available so I recommended that she try eating two kiwis per day or consuming prunes/prune juice. Will also check for some other causes of constipation, ab pain, and bloating by checking some labs. She has been noted to have the CHEK2 mutation in the past and thus would need to start colon cancer screening at  age 46. - Low FODMAP diet - Check TSH, CRP, TTG IgA, IgA - Encourage eating kiwis and/or prunes - Recall to start colon cancer screening at age 75 and repeat every 5 years due CHEK2 mutation - RTC PRN  Eulah Pont, MD  I spent 46 minutes of time, including in depth chart review, independent review of results as outlined above, communicating results with the patient directly, face-to-face time with the patient, coordinating care, ordering studies and medications as appropriate, and documentation.

## 2022-10-02 NOTE — Patient Instructions (Addendum)
Your provider has requested that you go to the basement level for lab work before leaving today. Press "B" on the elevator. The lab is located at the first door on the left as you exit the elevator.   Encourage eating kiwi and prunes daily  If your blood pressure at your visit was 140/90 or greater, please contact your primary care physician to follow up on this.  _______________________________________________________  If you are age 34 or older, your body mass index should be between 23-30. Your Body mass index is 21.63 kg/m. If this is out of the aforementioned range listed, please consider follow up with your Primary Care Provider.  If you are age 69 or younger, your body mass index should be between 19-25. Your Body mass index is 21.63 kg/m. If this is out of the aformentioned range listed, please consider follow up with your Primary Care Provider.   ________________________________________________________  The [facility] providers would like to encourage you to use [facility] to communicate with providers for non-urgent requests or questions.  Due to long hold times on the telephone, sending your provider a message by [facility] may be a faster and more efficient way to get a response.  Please allow 48 business hours for a response.  Please remember that this is for non-urgent requests.  _______________________________________________________   Due to recent changes in healthcare laws, you may see the results of your imaging and laboratory studies on MyChart before your provider has had a chance to review them.  We understand that in some cases there may be results that are confusing or concerning to you. Not all laboratory results come back in the same time frame and the provider may be waiting for multiple results in order to interpret others.  Please give [place] 48 hours in order for your provider to thoroughly review all the results before contacting the office for clarification of your results.     Thank you for entrusting me with your care and for choosing [facility], Dr. Eulah Pont

## 2022-10-03 LAB — IGA: Immunoglobulin A: 257 mg/dL (ref 47–310)

## 2022-10-04 LAB — TISSUE TRANSGLUTAMINASE, IGA: (tTG) Ab, IgA: <1 U/mL

## 2022-10-26 ENCOUNTER — Encounter: Payer: Self-pay | Admitting: Internal Medicine

## 2023-01-09 ENCOUNTER — Telehealth: Payer: Self-pay | Admitting: Hematology and Oncology

## 2023-01-09 NOTE — Telephone Encounter (Signed)
Left a message in regards to rescheduled appointment times/dates due to provider being out of office

## 2023-01-19 ENCOUNTER — Ambulatory Visit: Payer: 59 | Admitting: Hematology and Oncology

## 2023-01-29 ENCOUNTER — Telehealth: Payer: Self-pay | Admitting: Hematology and Oncology

## 2023-02-14 ENCOUNTER — Encounter: Payer: Self-pay | Admitting: Internal Medicine

## 2023-02-18 HISTORY — PX: HIP SURGERY: SHX245

## 2023-02-28 ENCOUNTER — Ambulatory Visit: Payer: 59 | Admitting: Hematology and Oncology

## 2023-03-01 ENCOUNTER — Telehealth: Payer: Self-pay | Admitting: Hematology and Oncology

## 2023-03-01 ENCOUNTER — Inpatient Hospital Stay: Payer: 59 | Admitting: Hematology and Oncology

## 2023-03-26 ENCOUNTER — Encounter: Payer: Self-pay | Admitting: Hematology and Oncology

## 2023-03-26 ENCOUNTER — Ambulatory Visit: Payer: 59 | Admitting: Hematology and Oncology

## 2023-04-10 ENCOUNTER — Ambulatory Visit: Payer: 59 | Admitting: Hematology and Oncology

## 2023-10-04 ENCOUNTER — Ambulatory Visit (INDEPENDENT_AMBULATORY_CARE_PROVIDER_SITE_OTHER)
Admission: RE | Admit: 2023-10-04 | Discharge: 2023-10-04 | Disposition: A | Discharge status: Home / Self Care | Source: Ambulatory Visit | Attending: Physician Assistant | Admitting: Physician Assistant

## 2023-10-04 ENCOUNTER — Encounter: Payer: Self-pay | Admitting: Physician Assistant

## 2023-10-04 ENCOUNTER — Ambulatory Visit: Admitting: Physician Assistant

## 2023-10-04 VITALS — BP 104/72 | HR 50 | Ht 65.0 in | Wt 130.0 lb

## 2023-10-04 DIAGNOSIS — R1319 Other dysphagia: Secondary | ICD-10-CM

## 2023-10-04 DIAGNOSIS — R1310 Dysphagia, unspecified: Secondary | ICD-10-CM | POA: Diagnosis not present

## 2023-10-04 DIAGNOSIS — D649 Anemia, unspecified: Secondary | ICD-10-CM

## 2023-10-04 DIAGNOSIS — R109 Unspecified abdominal pain: Secondary | ICD-10-CM | POA: Diagnosis not present

## 2023-10-04 DIAGNOSIS — R110 Nausea: Secondary | ICD-10-CM | POA: Diagnosis not present

## 2023-10-04 DIAGNOSIS — K589 Irritable bowel syndrome without diarrhea: Secondary | ICD-10-CM

## 2023-10-04 DIAGNOSIS — R140 Abdominal distension (gaseous): Secondary | ICD-10-CM

## 2023-10-04 DIAGNOSIS — Z1501 Genetic susceptibility to malignant neoplasm of breast: Secondary | ICD-10-CM

## 2023-10-04 DIAGNOSIS — K580 Irritable bowel syndrome with diarrhea: Secondary | ICD-10-CM

## 2023-10-04 MED ORDER — RIFAXIMIN 550 MG PO TABS: 550.0000 mg | ORAL_TABLET | Freq: Three times a day (TID) | ORAL | 0 refills | Status: AC

## 2023-10-04 NOTE — Patient Instructions (Addendum)
 Your provider has requested that you have an abdominal x ray before leaving today. Please go to the basement floor to our Radiology department for the test.  Your provider has requested that you go to the basement level for lab work IN 2 WEEKS, YOU DO NOT NEED AN APPOINTMENT. Press "B" on the elevator. The lab is located at the first door on the left as you exit the elevator.  You have been scheduled for an appointment with Quentin Mulling on 12-04-23 at 3pm . Please arrive 10 minutes early for your appointment.   FODMAP stands for fermentable oligo-, di-, mono-saccharides and polyols (1). These are the scientific terms used to classify groups of carbs that are difficult for our body to digest and that are notorious for triggering digestive symptoms like bloating, gas, loose stools and stomach pain.   You can try low FODMAP diet  - start with eliminating just one column at a time that you feel may be a trigger for you. - the table at the very bottom contains foods that are low in FODMAPs   Sometimes trying to eliminate the FODMAP's from your diet is difficult or tricky, if you are stuggling with trying to do the elimination diet you can try an enzyme.  There is a food enzymes that you sprinkle in or on your food that helps break down the FODMAP. You can read more about the enzyme by going to this site: https://fodzyme.com/   Pelvic Floor Dysfunction, Female Pelvic floor dysfunction (PFD) is a condition that results when the group of muscles and connective tissues that support the organs in the pelvis (pelvic floor muscles) do not work well. These muscles and their connections form a sling that supports the colon and bladder. In women, they also support the uterus. PFD causes pelvic floor muscles to be too weak, too tight, or both. In PFD, muscle movements are not coordinated. This may cause bowel or bladder problems. It may also cause pain. What are the causes? This condition may be caused by  an injury to the pelvic area or by a weakening of pelvic muscles. This often results from pregnancy and childbirth or other types of strain. In many cases, the exact cause is not known. What increases the risk? The following factors may make you more likely to develop this condition: Having chronic bladder tissue inflammation (interstitial cystitis). Being an older person. Being overweight. History of radiation treatment for cancer in the pelvic region. Previous pelvic surgery, such as removal of the uterus (hysterectomy). What are the signs or symptoms? Symptoms of this condition vary and may include: Bladder symptoms, such as: Trouble starting urination and emptying the bladder. Frequent urinary tract infections. Leaking urine when coughing, laughing, or exercising (stress incontinence). Having to pass urine urgently or frequently. Pain when passing urine. Bowel symptoms, such as: Constipation. Urgent or frequent bowel movements. Incomplete bowel movements. Painful bowel movements. Leaking stool or gas. Unexplained genital or rectal pain. Genital or rectal muscle spasms. Low back pain. Other symptoms may include: A heavy, full, or aching feeling in the vagina. A bulge that protrudes into the vagina. Pain during or after sex. How is this diagnosed? This condition may be diagnosed based on: Your symptoms and medical history. A physical exam. During the exam, your health care provider may check your pelvic muscles for tightness, spasm, pain, or weakness. This may include a rectal exam and a pelvic exam. In some cases, you may have diagnostic tests, such as: Electrical muscle function  tests. Urine flow testing. X-ray tests of bowel function. Ultrasound of the pelvic organs. How is this treated? Treatment for this condition depends on the symptoms. Treatment options include: Physical therapy. This may include Kegel exercises to help relax or strengthen the pelvic floor  muscles. Biofeedback. This type of therapy provides feedback on how tight your pelvic floor muscles are so that you can learn to control them. Internal or external massage therapy. A treatment that involves electrical stimulation of the pelvic floor muscles to help control pain (transcutaneous electrical nerve stimulation, or TENS). Sound wave therapy (ultrasound) to reduce muscle spasms. Medicines, such as: Muscle relaxants. Bladder control medicines. Surgery to reconstruct or support pelvic floor muscles may be an option if other treatments do not help. Follow these instructions at home: Activity Do your usual activities as told by your health care provider. Ask your health care provider if you should modify any activities. Do pelvic floor strengthening or relaxing exercises at home as told by your physical therapist. Lifestyle Maintain a healthy weight. Eat foods that are high in fiber, such as beans, whole grains, and fresh fruits and vegetables. Limit foods that are high in fat and processed sugars, such as fried or sweet foods. Manage stress with relaxation techniques such as yoga or meditation. General instructions If you have problems with leakage: Use absorbable pads or wear padded underwear. Wash frequently with mild soap. Keep your genital and anal area as clean and dry as possible. Ask your health care provider if you should try a barrier cream to prevent skin irritation. Take warm baths to relieve pelvic muscle tension or spasms. Take over-the-counter and prescription medicines only as told by your health care provider. Keep all follow-up visits. How is this prevented? The cause of PFD is not always known, but there are a few things you can do to reduce the risk of developing this condition, including: Staying at a healthy weight. Getting regular exercise. Managing stress. Contact a health care provider if: Your symptoms are not improving with home care. You have signs  or symptoms of PFD that get worse at home. You develop new signs or symptoms. You have signs of a urinary tract infection, such as: Fever. Chills. Increased urinary frequency. A burning feeling when urinating. You have not had a bowel movement in 3 days (constipation). Summary Pelvic floor dysfunction results when the muscles and connective tissues in your pelvic floor do not work well. These muscles and their connections form a sling that supports your colon and bladder. In women, they also support the uterus. PFD may be caused by an injury to the pelvic area or by a weakening of pelvic muscles. PFD causes pelvic floor muscles to be too weak, too tight, or a combination of both. Symptoms may vary from person to person. In most cases, PFD can be treated with physical therapies and medicines. Surgery may be an option if other treatments do not help. This information is not intended to replace advice given to you by your health care provider. Make sure you discuss any questions you have with your health care provider. Document Revised: 10/13/2020 Document Reviewed: 10/13/2020 Elsevier Patient Education  2022 [employer]  Abdominal bloating and discomfort may be due to intestinal sensitivity or symptoms of irritable bowel syndrome. To relieve symptoms, avoid:  Broccoli  Baked beans  Cabbage  Carbonated drinks  Cauliflower  Chewing gum  Hard candy Abdominal distention resulting from weak abdominal muscles:  Is better in the morning  Gets worse  as the day progresses  Is relieved by lying down Flatulence is gas created through bacterial action in the bowel and passed rectally. Keep in mind that:  10-18 passages per day are normal  Primary gases are harmless and odorless  Noticeable smells are trace gases related to food intake Foods to AVOID that are likely to form gas include:  Milk, dairy products, and medications that contain lactose--If your body doesn't produce the enzyme  (lactase) to break it down.  Certain vegetables--baked beans, cauliflower, broccoli, cabbage  Certain starches--wheat, oats, corn, potatoes. Rice is a good substitute. Identify offending foods. Reduce or eliminate these gas-forming foods from your diet. Can look at the FODMAP diet.

## 2023-10-04 NOTE — Progress Notes (Signed)
 10/04/2023 Dawn [name] [id number] Jul 27, 1990  Referring provider: No ref. provider found Primary GI doctor: Dr. Rosaline Coma  ASSESSMENT AND PLAN:   Abdominal bloating after eating 10/02/2022 CRP negative celiac negative thyroid normal - Provide guidance on FODMAP diet, try to find protein source other than beans - Prescribe Xifaxan for potential SIBO/IBS - Re-evaluate celiac disease with gluten challenge and subsequent testing, patient was on gluten free diet last visit with testing - Consider endoscopy if symptoms persist after initial interventions.  Nausea without vomiting, can have occ dysphagia with dry foods, which she assumes is from sjogren's no gerd since stopping meat products She has history of eczema and allergies, no asthma Rare NSAIDS, rare ETOH, no drug use, no smoking Can have nausea with greasy foods like fries, no family history of GB issues - Consider endoscopy to evaluate for EOE and other esophageal conditions such as celiac, patient declines at this time due to cost - Provide information on Sjogren's syndrome and potential impact on swallowing. - consider AB US  but no RUQ pain on palpation  IBS Since childhood - Order abdominal x-ray to assess stool burden. - Consider pelvic floor physical therapy if symptoms persist.  CHEK2 monoallelic mutation  Start colon cancer screening age 56 repeat every 5 years  Anemia Has right hip surgery for dysplasia in Sept, had normocytic anemia in Oct 03/17/2021  HGB 14.1 MCV 89.9 Platelets 257 03/21/23 HGB 10.2 Likely from surgery, no black stool/blood in stool.  Will recheck CBC, get iron/ferritin/B12 if persistent  Patient Care Team: Pcp, No as PCP - General  HISTORY OF PRESENT ILLNESS: 33 y.o. female with a past medical history listed below presents for evaluation of bloating.   Discussed the use of AI scribe software for clinical note transcription with the patient, who gave verbal consent to proceed.  History of  Present Illness   Dawn [name] is a 33 year old female with chronic constipation and bloating who presents for evaluation of gastrointestinal symptoms.  She has a history of chronic idiopathic constipation since childhood. Despite dietary modifications, including the FODMAP diet, she finds it challenging due to her vegetarian diet primarily consisting of beans and vegetables. Her constipation has improved since her hip surgery, with daily bowel movements reported over the past few months. Prunes are helpful, but she cannot tolerate kiwis due to a past adverse reaction. Despite regular bowel movements, she still experiences bloating, which is not always linked to constipation. No dark, black, or bloody stools are reported.  She experiences significant bloating after meals, describing it as feeling 'four months pregnant' at times. The bloating occurs despite eating the same foods daily, with no specific trigger identified. Nausea occurs intermittently, often related to greasy foods, but there is no vomiting. She reports occasional difficulty swallowing dry foods without liquid, attributing it to dryness rather than a mechanical issue. A history of reflux improved after eliminating meat from her diet, and she denies current heartburn or reflux symptoms.  Her last lab work in April showed no inflammation, negative celiac, and normal thyroid function. In October, her hemoglobin dropped to 10.8 from a previous 14, likely related to a hip surgery she underwent on September 30, which involved PAO and resulted in anemia. She currently takes Advil as needed.  She urinates frequently, about every hour, and used to [name] up every couple of hours at night to urinate, which has improved recently. She denies burning or urgency except when anxious. She has a history of eczema and general allergies,  with dermatitis occurring frequently. She does not smoke and consumes alcohol occasionally, typically a glass of wine or an  ounce of whiskey a couple of times a week.        She  reports that she has never smoked. She has never used smokeless tobacco. She reports current alcohol use of about 2.0 standard drinks of alcohol per week. She reports that she does not use drugs.  RELEVANT GI HISTORY, IMAGING AND LABS: Results   LABS Hb: 10.8 g/dL (16/1096)      CBC    Component Value Date/Time   WBC 5.7 03/17/2021 0916   RBC 4.66 03/17/2021 0916   HGB 14.1 03/17/2021 0916   HGB 14.1 03/15/2020 0843   HGB 14.3 09/03/2015 0936   HCT 41.9 03/17/2021 0916   HCT 42.8 03/15/2020 0843   PLT 257 03/17/2021 0916   PLT 267 03/15/2020 0843   MCV 89.9 03/17/2021 0916   MCV 90 03/15/2020 0843   MCH 30.3 03/17/2021 0916   MCHC 33.7 03/17/2021 0916   RDW 12.0 03/17/2021 0916   RDW 11.8 03/15/2020 0843   LYMPHSABS 1.9 02/07/2018 1644   EOSABS 0.1 02/07/2018 1644   BASOSABS 0.0 02/07/2018 1644   No results for input(s): "HGB" in the last 8760 hours.  CMP     Component Value Date/Time   NA 138 03/17/2021 0916   NA 139 03/15/2020 0843   K 4.2 03/17/2021 0916   CL 105 03/17/2021 0916   CO2 25 03/17/2021 0916   GLUCOSE 80 03/17/2021 0916   BUN 9 03/17/2021 0916   BUN 12 03/15/2020 0843   CREATININE 0.52 03/17/2021 0916   CALCIUM 9.6 03/17/2021 0916   PROT 7.5 03/17/2021 0916   PROT 7.4 03/15/2020 0843   ALBUMIN 4.7 03/15/2020 0843   AST 22 03/17/2021 0916   ALT 18 03/17/2021 0916   ALKPHOS 50 03/15/2020 0843   BILITOT 0.7 03/17/2021 0916   BILITOT 0.7 03/15/2020 0843   GFRNONAA 123 03/15/2020 0843   GFRAA 141 03/15/2020 0843      Latest Ref Rng & Units 03/17/2021    9:16 AM 03/15/2020    8:43 AM 12/06/2018    8:50 AM  Hepatic Function  Total Protein 6.1 - 8.1 g/dL 7.5  7.4  7.0   Albumin 3.8 - 4.8 g/dL  4.7  4.5   AST 10 - 30 U/L 22  20  18    ALT 6 - 29 U/L 18  14  16    Alk Phosphatase 44 - 121 IU/L  50  55   Total Bilirubin 0.2 - 1.2 mg/dL 0.7  0.7  0.7       Current Medications:   Current  Outpatient Medications (Endocrine & Metabolic):    norethindrone (MICRONOR) 0.35 MG tablet, Take 1 tablet (0.35 mg total) by mouth daily.    Current Outpatient Medications (Analgesics):    Ibuprofen (ADVIL PO), Take by mouth as needed.   Current Outpatient Medications (Other):    Cholecalciferol 125 MCG (5000 UT) capsule, Take 5,000 Units by mouth daily.   L-Theanine 200 MG CAPS, Take 200 mg by mouth at bedtime.   magnesium gluconate (MAGONATE) 500 MG tablet, Take 500 mg by mouth 2 (two) times daily.   MELATONIN PO, Take by mouth. 5 mg at bedtime   OVER THE COUNTER MEDICATION, COLLAGEN, MOVE FREE BRAND 1 PER DAY   polyethylene glycol (MIRALAX / GLYCOLAX) 17 g packet, Take 17 g by mouth daily.   rifaximin (XIFAXAN) 550 MG  TABS tablet, Take 1 tablet (550 mg total) by mouth 3 (three) times daily for 14 days.  Medical History:  Past Medical History:  Diagnosis Date   Anxiety    Breast mass 07/25/2016   Pain with lump   Family history of breast cancer    Family history of colon cancer    Family history of esophageal cancer    Family history of gene mutation    Family history of melanoma    Fracture of left foot 2007   treated with brace   Hx of degenerative disc disease    Migraine    with aura   Monoallelic mutation of CHEK2 gene in female patient    Oligomenorrhea    better on OCP   Sjogren's syndrome (HCC) 2018   Tendonitis    Allergies:  Allergies  Allergen Reactions   Amoxicillin    Ceclor [Cefaclor]    Gabapentin     Thinks it makes her constipated   Penicillins    Prednisone Diarrhea and Nausea And Vomiting     Surgical History:  She  has a past surgical history that includes Wisdom tooth extraction; Irrigation and debridement sabaceous cyst (08/17/2012); fishbone removal; Cervical disk replacement (01/2021); and Hip surgery (Right, 02/2023). Family History:  Her family history includes Alzheimer's disease in her paternal grandfather; Breast cancer (age of  onset: 93) in her maternal grandmother; Breast cancer (age of onset: 17) in her maternal aunt; Colon cancer (age of onset: 3) in her paternal grandfather; Esophageal cancer in her paternal uncle; Hyperlipidemia in her mother; Hypertension in her mother; Melanoma (age of onset: 10) in her father; Other in her mother; Parkinson's disease in her paternal grandmother.  REVIEW OF SYSTEMS  : All other systems reviewed and negative except where noted in the History of Present Illness.  PHYSICAL EXAM: BP 104/72   Pulse (!) 50   Ht 5\' 5"  (1.651 m)   Wt 130 lb (59 kg) Comment: verbal  BMI 21.63 kg/m  Physical Exam   GENERAL APPEARANCE: Well nourished, in no apparent distress. HEENT: No cervical lymphadenopathy, unremarkable thyroid, sclerae anicteric, conjunctiva pink. RESPIRATORY: Respiratory effort normal, breath sounds equal bilaterally without rales, rhonchi, or wheezing. CARDIO: Regular rate and rhythm with no murmurs, rubs, or gallops, peripheral pulses intact. ABDOMEN: Soft, non-distended, active bowel sounds in all four quadrants, right lower quadrant tenderness, no rebound tenderness, no mass appreciated. RECTAL: Declines. MUSCULOSKELETAL: Full range of motion, normal gait, without edema. SKIN: Dry, intact without rashes or lesions. No jaundice. NEURO: Alert, oriented, no focal deficits. PSYCH: Cooperative, normal mood and affect.      Edmonia Gottron, PA-C 3:35 PM

## 2023-10-04 NOTE — Progress Notes (Signed)
 I agree with the assessment and plan as outlined by Ms. Steffanie Dunn.

## 2023-10-10 DIAGNOSIS — R110 Nausea: Secondary | ICD-10-CM

## 2023-10-10 DIAGNOSIS — K580 Irritable bowel syndrome with diarrhea: Secondary | ICD-10-CM

## 2023-10-10 DIAGNOSIS — R109 Unspecified abdominal pain: Secondary | ICD-10-CM

## 2023-10-10 DIAGNOSIS — R140 Abdominal distension (gaseous): Secondary | ICD-10-CM

## 2023-10-10 DIAGNOSIS — J309 Allergic rhinitis, unspecified: Secondary | ICD-10-CM

## 2023-10-19 ENCOUNTER — Other Ambulatory Visit (INDEPENDENT_AMBULATORY_CARE_PROVIDER_SITE_OTHER)

## 2023-10-19 DIAGNOSIS — R140 Abdominal distension (gaseous): Secondary | ICD-10-CM | POA: Diagnosis not present

## 2023-10-19 LAB — COMPREHENSIVE METABOLIC PANEL WITH GFR
ALT: 12 U/L (ref 0–35)
AST: 19 U/L (ref 0–37)
Albumin: 5 g/dL (ref 3.5–5.2)
Alkaline Phosphatase: 46 U/L (ref 39–117)
BUN: 9 mg/dL (ref 6–23)
CO2: 27 meq/L (ref 19–32)
Calcium: 9.6 mg/dL (ref 8.4–10.5)
Chloride: 102 meq/L (ref 96–112)
Creatinine, Ser: 0.63 mg/dL (ref 0.40–1.20)
GFR: 115.34 mL/min (ref >60.00)
Glucose, Bld: 93 mg/dL (ref 70–99)
Potassium: 4 meq/L (ref 3.5–5.1)
Sodium: 137 meq/L (ref 135–145)
Total Bilirubin: 0.8 mg/dL (ref 0.2–1.2)
Total Protein: 8.1 g/dL (ref 6.0–8.3)

## 2023-10-20 LAB — CBC WITH DIFFERENTIAL/PLATELET
Basophils Absolute: 0.1 10*3/uL (ref 0.0–0.1)
Basophils Relative: 0.9 % (ref 0.0–3.0)
Eosinophils Absolute: 0.1 10*3/uL (ref 0.0–0.7)
Eosinophils Relative: 1.8 % (ref 0.0–5.0)
HCT: 40.3 % (ref 36.0–46.0)
Hemoglobin: 13.8 g/dL (ref 12.0–15.0)
Lymphocytes Relative: 26.5 % (ref 12.0–46.0)
Lymphs Abs: 1.6 10*3/uL (ref 0.7–4.0)
MCHC: 34.2 g/dL (ref 30.0–36.0)
MCV: 87.5 fl (ref 78.0–100.0)
Monocytes Absolute: 0.5 10*3/uL (ref 0.1–1.0)
Monocytes Relative: 7.6 % (ref 3.0–12.0)
Neutro Abs: 3.7 10*3/uL (ref 1.4–7.7)
Neutrophils Relative %: 63.2 % (ref 43.0–77.0)
Platelets: 334 10*3/uL (ref 150.0–400.0)
RBC: 4.6 Mil/uL (ref 3.87–5.11)
RDW: 12.8 % (ref 11.5–15.5)
WBC: 5.9 10*3/uL (ref 4.0–10.5)

## 2023-10-20 LAB — TISSUE TRANSGLUTAMINASE, IGA: (tTG) Ab, IgA: <1 U/mL

## 2023-10-20 LAB — IGA: Immunoglobulin A: 247 mg/dL (ref 47–310)

## 2023-10-26 NOTE — Addendum Note (Signed)
 Addended by: Santina Cull on: 10/26/2023 12:29 PM   Modules accepted: Orders

## 2023-10-29 ENCOUNTER — Other Ambulatory Visit (INDEPENDENT_AMBULATORY_CARE_PROVIDER_SITE_OTHER)

## 2023-10-29 DIAGNOSIS — R140 Abdominal distension (gaseous): Secondary | ICD-10-CM | POA: Diagnosis not present

## 2023-10-29 DIAGNOSIS — R110 Nausea: Secondary | ICD-10-CM

## 2023-10-29 DIAGNOSIS — R109 Unspecified abdominal pain: Secondary | ICD-10-CM

## 2023-10-29 LAB — SEDIMENTATION RATE: Sed Rate: 3 mm/h (ref 0–20)

## 2023-10-29 LAB — CK: Total CK: 57 U/L (ref 7–177)

## 2023-10-29 LAB — LIPASE: Lipase: 34 U/L (ref 11.0–59.0)

## 2023-10-29 LAB — C-REACTIVE PROTEIN: CRP: <1 mg/dL (ref 0.5–20.0)

## 2023-11-01 ENCOUNTER — Ambulatory Visit: Payer: Self-pay | Admitting: Physician Assistant

## 2023-11-01 LAB — ALPHA-GAL PANEL
Allergen, Mutton, f88: <0.1 kU/L
Allergen, Pork, f26: <0.1 kU/L
Beef: <0.1 kU/L
CLASS: 0
CLASS: 0
Class: 0
GALACTOSE-ALPHA-1,3-GALACTOSE IGE*: <0.1 kU/L (ref <0.10)

## 2023-11-01 LAB — EXTRA SPECIMEN

## 2023-11-01 LAB — C1 ESTERASE INHIBITOR, FUNCTIONAL: C1 Esterase Inhibitor Funct: >100 % (ref >=68)

## 2023-11-01 LAB — C3 AND C4
C3 Complement: 93 mg/dL (ref 83–193)
C4 Complement: 19 mg/dL (ref 15–57)

## 2023-11-01 LAB — INTERPRETATION:

## 2023-11-01 LAB — TRYPTASE: Tryptase: 5.4 ug/L (ref <11.0)

## 2023-12-04 ENCOUNTER — Ambulatory Visit: Admitting: Physician Assistant

## 2023-12-10 ENCOUNTER — Encounter: Payer: Self-pay | Admitting: Internal Medicine

## 2023-12-10 ENCOUNTER — Ambulatory Visit: Admitting: Internal Medicine

## 2023-12-10 VITALS — BP 120/72 | HR 81 | Temp 97.8°F | Resp 18 | Ht 64.96 in | Wt 130.7 lb

## 2023-12-10 DIAGNOSIS — L299 Pruritus, unspecified: Secondary | ICD-10-CM

## 2023-12-10 DIAGNOSIS — Z880 Allergy status to penicillin: Secondary | ICD-10-CM

## 2023-12-10 DIAGNOSIS — R140 Abdominal distension (gaseous): Secondary | ICD-10-CM | POA: Diagnosis not present

## 2023-12-10 DIAGNOSIS — Z888 Allergy status to other drugs, medicaments and biological substances status: Secondary | ICD-10-CM | POA: Diagnosis not present

## 2023-12-10 DIAGNOSIS — R0989 Other specified symptoms and signs involving the circulatory and respiratory systems: Secondary | ICD-10-CM

## 2023-12-10 DIAGNOSIS — K219 Gastro-esophageal reflux disease without esophagitis: Secondary | ICD-10-CM

## 2023-12-10 DIAGNOSIS — T380X5A Adverse effect of glucocorticoids and synthetic analogues, initial encounter: Secondary | ICD-10-CM

## 2023-12-10 NOTE — Progress Notes (Signed)
 FOLLOW UP Date of Service/Encounter:   12/10/2023  Subjective:  Dawn [name] (DOB: Sep 26, 1990) is a 33 y.o. female who [facility] in re-evaluation of the following: concerns for food allergy  (new), bloating, abdominal pain/distension, penicillin allergy , prednisone allergy  History obtained from: chart review and patient.  For Review, LV was on 01/28/21  with Wanda Craze, FNP seen for true patch test final read. ----------------------------------------------------- Pertinent History/Diagnostics:  Contact dermatis concerns: 1+ to nickely sulfate 5% --------------------------------------------------- Today presents for follow-up. Discussed the use of AI scribe software for clinical note transcription with the patient, who gave verbal consent to proceed.  History of Present Illness   Dawn [name] is a 33 year old female who presents with gastrointestinal symptoms including bloating and constipation.  She experiences bloating and constipation, with constipation being a lifelong issue and bloating developing over the past couple of years. Acid reflux and a sensation of fullness in the throat after eating are also present, sometimes requiring her to cough. Benadryl helps alleviate this sensation.  She has not undergone any diagnostic procedures such as an upper endoscopy or colonoscopy yet. She is scheduled for a hip screening in August, after which she plans to pursue further gastrointestinal evaluation.  She has not identified specific foods that trigger these symptoms. She suspects spicy foods and garlic might be related to her symptoms, as she notices the sensation of fullness after consuming meals containing these ingredients. She denies any other systemic symptoms.  She occasionally experiences an itchy mouth when eating avocado. She recalls being allergic to penicillin as a child, with symptoms of eczema and rash, but she does not remember the  details as she was very young.  She had a severe reaction to prednisone in the past, characterized by vomiting and diarrhea, which led to an inability to eat for a week. She is uncertain if this was an allergic reaction but prefers to avoid prednisone in the future.  No specific food-related hives, swelling, or itching. She experiences an itchy mouth with avocado consumption. No immediate histamine-mediated symptoms such as hives, swelling, vomiting, or diarrhea within two hours of eating.      Chart Review: 2022: previous patch testing for contact dermatitis,  All medications reviewed by clinical staff and updated in chart. No new pertinent medical or surgical history except as noted in HPI.  ROS: All others negative except as noted per HPI.   Objective:  BP 120/72 (BP Location: Right Arm, Patient Position: Sitting, Cuff Size: Normal)   Pulse 81   Temp 97.8 F (36.6 C)   Resp 18   Ht 5' 4.96 (1.65 m)   Wt 130 lb 11.2 oz (59.3 kg)   SpO2 98%   BMI 21.78 kg/m  Body mass index is 21.78 kg/m. Physical Exam: General Appearance:  Alert, cooperative, no distress, appears stated age  Head:  Normocephalic, without obvious abnormality, atraumatic  Eyes:  Conjunctiva clear, EOM's intact  Nose: Nares normal, no visible rhinorrhea  Throat: Lips, tongue normal; teeth and gums normal, normal posterior oropharynx  Neck: Supple, symmetrical  Lungs:   clear to auscultation bilaterally, Respirations unlabored, no coughing  Heart:  regular rate and rhythm and no murmur, Appears well perfused  Extremities: No edema  Skin: Skin color, texture, turgor normal and no rashes or lesions on visualized portions of skin  Neurologic: No gross deficits   Labs:  Lab Orders  No laboratory test(s) ordered today   Assessment/Plan   Assessment and  Plan  Pollen food allergy  syndrome-new problem, chronic; not life threatening Itchy mouth with avocado likely due to birch pollen-related allergy . Symptoms  mild, managed by avoidance or antihistamines. - Avoid avocado or take Zyrtec if symptoms occur.  Penicillin allergy -new problem, potentially life threatening Reported childhood eczema and rash after penicillin. Likely outgrown or never true allergy . Most people outgrow penicillin allergy  after 10 years. Discussed penicillin allergy  testing. - Schedule penicillin skin testing. - If skin test is negative, perform amoxicillin challenge.  Adverse reaction to prednisone-new problem, potentially life threatening. Severe vomiting and diarrhea post-prednisone Recommended avoidance and alternative steroid class if needed. - Avoid prednisone. - Consider alternative class of steroids if needed.  Gastrointestinal symptoms-new and chronic problem,  Chronic constipation, bloating, acid reflux, throat fullness. No specific food triggers. Differential includes food intolerance, eosinophilic esophagitis, severe acid reflux among others. No evidence of IgE mediated food allergy . GI workup recommended. - Proceed with GI workup  - Refer to a registered dietitian for dietary guidance and management.   Follow up : for penicillin testing It was a pleasure meeting you in clinic today! Thank you for allowing me to participate in your care.  Rocky Endow, MD [facility]       Other: none  Rocky Endow, MD  [facility] 

## 2023-12-10 NOTE — Patient Instructions (Addendum)
 Pollen food allergy  syndrome Itchy mouth with avocado likely due to birch pollen-related allergy . Symptoms mild, managed by avoidance or antihistamines. - Avoid avocado or take Zyrtec if symptoms occur.  Penicillin allergy  Reported childhood eczema and rash after penicillin. Likely outgrown or never true allergy . Most people outgrow penicillin allergy  after 10 years. Discussed penicillin allergy  testing. - Schedule penicillin skin testing. - If skin test is negative, perform amoxicillin challenge.  Adverse reaction to prednisone Severe vomiting and diarrhea post-prednisone Recommended avoidance and alternative steroid class if needed. - Avoid prednisone. - Consider alternative class of steroids if needed.  Gastrointestinal symptoms Chronic constipation, bloating, acid reflux, throat fullness. No specific food triggers. Differential includes food intolerance, eosinophilic esophagitis, severe acid reflux among others. No evidence of IgE mediated food allergy . GI workup recommended. - Proceed with GI workup  - Refer to a registered dietitian for dietary guidance and management.   Follow up : for penicillin testing It was a pleasure meeting you in clinic today! Thank you for allowing me to participate in your care.  Rocky Endow, MD [facility]

## 2023-12-12 ENCOUNTER — Telehealth: Payer: Self-pay | Admitting: Internal Medicine

## 2023-12-12 NOTE — Telephone Encounter (Signed)
 Dawn [name] has been internally referred to:  Dr. Emil and [facility] at [facility] [address] [place],  [place]  [address] Main: [phone]  They will reach out to [place] to schedule.  I will follow up in a week.

## 2023-12-18 NOTE — Progress Notes (Unsigned)
 12/19/2023 Dawn [name] [id number] [birth year]  Referring provider: No ref. provider found Primary GI doctor: Dr. Federico  ASSESSMENT AND PLAN:   Nausea without vomiting, can have occ dysphagia dry food/pills She has history of eczema and allergies, no asthma Rare NSAIDS, rare ETOH, no drug use, no smoking - schedule endoscopy with colonoscopy at [facility] to evaluate for EOE and other esophageal conditions, would like this done after she has hip surgery scheduled Aug 14th and would like it done after that due to anesthesia, will have to call her to schedule Oct - will call us  if any further issues - consider cross sectional imaging if EGD/colon negative  IBS Since childhood but now with loose stools previous constipation negative celiac, alpha gal, mast cell activation, complements and IgA, CRP and CPK, lipase.   KUB showed large stool burden and possible left renal stone.   Patient was given trial of rifaximin  that caused loose stools, 1-2 times a day. - will get colonoscopy with change in bowel habits and abnormal monoallelic mutation - on VSL 3 probiotic that has helped some, continue for one month and stop - Consider pelvic floor physical therapy if symptoms persist.  CHEK2 monoallelic mutation  Start colon cancer screening age 1 repeat every 5 years  Abdominal bloating after eating 10/02/2022 CRP negative celiac negative thyroid  normal Xifaxan  helped bloating celiac negative with gluten challenge - has improved - continue to work with dietician for FODMAP diet  Anemia 10/19/2023  HGB 13.8 MCV 87.5 Platelets 334.0 Recent Labs    10/19/23 1410  HGB 13.8  Had right hip surgery for dysplasia in Sept, had normocytic anemia in Oct, resolved that OV likely due to surgery Having repeat surgery to remove screws August 14th  Patient Care Team: Pcp, No as PCP - General  HISTORY OF PRESENT ILLNESS: 33 y.o. female with a past medical history listed below presents for evaluation of  bloating.   Patient was last seen in the office 10/04/2023 by myself for bloating.  She had unremarkable CBC c-Met negative celiac, alpha gal, mast cell activation, complements and IgA, CRP and CPK, lipase.  KUB showed large stool burden and possible left renal stone.  Patient was given trial of rifaximin . Patient was taking oregano oil but had acute abdominal pain with this.  Discussed the use of AI scribe software for clinical note transcription with the patient, who gave verbal consent to proceed.  History of Present Illness   Dawn [name] is a 33 year old female who presents with gastrointestinal symptoms including diarrhea and bloating.  She has a history of gastrointestinal symptoms, primarily diarrhea and bloating. Initially, she experienced constipation but now has frequent bowel movements, typically once or twice a day, with varying consistency from watery to 'soft serve'. She attributes the change in bowel habits to the use of Xifaxan , which initially caused diarrhea. She is currently taking a probiotic, BSL-3, which she believes has helped to some extent.  She follows a modified diet with the guidance of a dietitian, focusing on low FODMAP foods. Her diet is high in fiber, including beans, lentils, and chickpeas, but she notes these foods may contribute to bloating. She attempted to reintroduce chickpeas into her diet but experienced immediate bloating. She has tried Beano in the past without success.  She denies any abdominal pain but mentions occasional discomfort, particularly on her right side, which resolves spontaneously. No blood in her stool, but she describes it as black at times. She experiences frequent swallowing  and occasional discomfort in her lower quadrant.  She is currently exercising again and states her hip has been better. She is taking Advil for a sty in her left eye, which she suspects may have contributed to recent bloating. She is concerned about her family history of  colon cancer, as her grandfather had the condition, and her mother had a negative experience with a colonoscopy.        She  reports that she has never smoked. She has never been exposed to tobacco smoke. She has never used smokeless tobacco. She reports current alcohol use of about 2.0 standard drinks of alcohol per week. She reports that she does not use drugs.  RELEVANT GI HISTORY, IMAGING AND LABS: Results          CBC    Component Value Date/Time   WBC 5.9 10/19/2023 1410   RBC 4.60 10/19/2023 1410   HGB 13.8 10/19/2023 1410   HGB 14.1 03/15/2020 0843   HGB 14.3 09/03/2015 0936   HCT 40.3 10/19/2023 1410   HCT 42.8 03/15/2020 0843   PLT 334.0 10/19/2023 1410   PLT 267 03/15/2020 0843   MCV 87.5 10/19/2023 1410   MCV 90 03/15/2020 0843   MCH 30.3 03/17/2021 0916   MCHC 34.2 10/19/2023 1410   RDW 12.8 10/19/2023 1410   RDW 11.8 03/15/2020 0843   LYMPHSABS 1.6 10/19/2023 1410   LYMPHSABS 1.9 02/07/2018 1644   MONOABS 0.5 10/19/2023 1410   EOSABS 0.1 10/19/2023 1410   EOSABS 0.1 02/07/2018 1644   BASOSABS 0.1 10/19/2023 1410   BASOSABS 0.0 02/07/2018 1644   Recent Labs    10/19/23 1410  HGB 13.8    CMP     Component Value Date/Time   NA 137 10/19/2023 1410   NA 139 03/15/2020 0843   K 4.0 10/19/2023 1410   CL 102 10/19/2023 1410   CO2 27 10/19/2023 1410   GLUCOSE 93 10/19/2023 1410   BUN 9 10/19/2023 1410   BUN 12 03/15/2020 0843   CREATININE 0.63 10/19/2023 1410   CREATININE 0.52 03/17/2021 0916   CALCIUM 9.6 10/19/2023 1410   PROT 8.1 10/19/2023 1410   PROT 7.4 03/15/2020 0843   ALBUMIN 5.0 10/19/2023 1410   ALBUMIN 4.7 03/15/2020 0843   AST 19 10/19/2023 1410   ALT 12 10/19/2023 1410   ALKPHOS 46 10/19/2023 1410   BILITOT 0.8 10/19/2023 1410   BILITOT 0.7 03/15/2020 0843   GFRNONAA 123 03/15/2020 0843   GFRAA 141 03/15/2020 0843      Latest Ref Rng & Units 10/19/2023    2:10 PM 03/17/2021    9:16 AM 03/15/2020    8:43 AM  Hepatic Function   Total Protein 6.0 - 8.3 g/dL 8.1  7.5  7.4   Albumin 3.5 - 5.2 g/dL 5.0   4.7   AST 0 - 37 U/L 19  22  20    ALT 0 - 35 U/L 12  18  14    Alk Phosphatase 39 - 117 U/L 46   50   Total Bilirubin 0.2 - 1.2 mg/dL 0.8  0.7  0.7       Current Medications:   Current Outpatient Medications (Endocrine & Metabolic):    norethindrone  (MICRONOR ) 0.35 MG tablet, Take 1 tablet (0.35 mg total) by mouth daily.    Current Outpatient Medications (Analgesics):    aspirin-acetaminophen-caffeine (EXCEDRIN MIGRAINE) 250-250-65 MG tablet, Take 2 tablets by mouth every 6 (six) hours as needed for headache or migraine.   Ibuprofen (  ADVIL PO), Take by mouth as needed.   Current Outpatient Medications (Other):    Cholecalciferol 125 MCG (5000 UT) capsule, Take 5,000 Units by mouth daily.   L-Theanine 200 MG CAPS, Take 200 mg by mouth at bedtime.   lidocaine (LIDODERM) 5 %, Place 1 patch onto the skin daily.   magnesium gluconate (MAGONATE) 500 MG tablet, Take 500 mg by mouth 2 (two) times daily.   MELATONIN PO, Take by mouth. 5 mg at bedtime   OVER THE COUNTER MEDICATION, COLLAGEN, MOVE FREE BRAND 1 PER DAY   polyethylene glycol (MIRALAX / GLYCOLAX) 17 g packet, Take 17 g by mouth daily.  Medical History:  Past Medical History:  Diagnosis Date   Anxiety    Breast mass 07/25/2016   Pain with lump   Family history of breast cancer    Family history of colon cancer    Family history of esophageal cancer    Family history of gene mutation    Family history of melanoma    Fracture of left foot 2007   treated with brace   Hx of degenerative disc disease    Migraine    with aura   Monoallelic mutation of CHEK2 gene in female patient    Oligomenorrhea    better on OCP   Sjogren's syndrome (HCC) 2018   Tendonitis    Allergies:  Allergies  Allergen Reactions   Latex Dermatitis   Nickel Dermatitis   Amoxicillin    Ceclor [Cefaclor]    Gabapentin     Thinks it makes her constipated    Penicillins    Prednisone Diarrhea and Nausea And Vomiting   Silicone Dermatitis     Surgical History:  She  has a past surgical history that includes Wisdom tooth extraction; Irrigation and debridement sabaceous cyst (08/17/2012); fishbone removal; Cervical disk replacement (01/2021); and Hip surgery (Right, 02/2023). Family History:  Her family history includes Alzheimer's disease in her paternal grandfather; Breast cancer (age of onset: 75) in her maternal grandmother; Breast cancer (age of onset: 57) in her maternal aunt; Colon cancer (age of onset: 73) in her paternal grandfather; Esophageal cancer in her paternal uncle; Hyperlipidemia in her mother; Hypertension in her mother; Melanoma (age of onset: 90) in her father; Other in her mother; Parkinson's disease in her paternal grandmother.  REVIEW OF SYSTEMS  : All other systems reviewed and negative except where noted in the History of Present Illness.  PHYSICAL EXAM: BP 130/78   Pulse 74   Ht 5' 5 (1.651 m)   Wt 130 lb (59 kg)   BMI 21.63 kg/m  Physical Exam   GENERAL APPEARANCE: Well nourished, in no apparent distress HEENT: No cervical lymphadenopathy, unremarkable thyroid , sclerae anicteric, conjunctiva pink RESPIRATORY: Respiratory effort normal, BS equal bilateral without rales, rhonchi, wheezing CARDIO: RRR with no MRGs, peripheral pulses intact ABDOMEN: Soft, non distended, active bowel sounds in all 4 quadrants, no tenderness to palpation, no rebound, no mass appreciated RECTAL: declines MUSCULOSKELETAL: Full ROM, normal gait, without edema SKIN: Dry, intact without rashes or lesions. No jaundice. NEURO: Alert, oriented, no focal deficits PSYCH: Cooperative, normal mood and affect.      Alan JONELLE Coombs, PA-C 9:14 AM

## 2023-12-19 ENCOUNTER — Ambulatory Visit: Admitting: Physician Assistant

## 2023-12-19 ENCOUNTER — Encounter: Payer: Self-pay | Admitting: Physician Assistant

## 2023-12-19 VITALS — BP 130/78 | HR 74 | Ht 65.0 in | Wt 130.0 lb

## 2023-12-19 DIAGNOSIS — R140 Abdominal distension (gaseous): Secondary | ICD-10-CM

## 2023-12-19 DIAGNOSIS — R109 Unspecified abdominal pain: Secondary | ICD-10-CM | POA: Diagnosis not present

## 2023-12-19 DIAGNOSIS — R110 Nausea: Secondary | ICD-10-CM

## 2023-12-19 DIAGNOSIS — Z1501 Genetic susceptibility to malignant neoplasm of breast: Secondary | ICD-10-CM

## 2023-12-19 DIAGNOSIS — D649 Anemia, unspecified: Secondary | ICD-10-CM

## 2023-12-19 DIAGNOSIS — R194 Change in bowel habit: Secondary | ICD-10-CM

## 2023-12-19 DIAGNOSIS — R1310 Dysphagia, unspecified: Secondary | ICD-10-CM | POA: Diagnosis not present

## 2023-12-19 DIAGNOSIS — R1319 Other dysphagia: Secondary | ICD-10-CM

## 2023-12-19 DIAGNOSIS — K580 Irritable bowel syndrome with diarrhea: Secondary | ICD-10-CM

## 2023-12-19 NOTE — Patient Instructions (Signed)
 It has been recommended to you by your physician that you have an endoscopy and colonoscopy completed. Per your request, we did not schedule the procedures today. Please contact our office at [phone] should you decide to have the procedure completed. You will be scheduled for a pre-visit and procedure at that time.  Thank you for trusting me with your gastrointestinal care!   Alan Coombs, PA-C   _______________________________________________________  If your blood pressure at your visit was 140/90 or greater, please contact your primary care physician to follow up on this.  _______________________________________________________  If you are age 35 or older, your body mass index should be between 23-30. Your Body mass index is 21.63 kg/m. If this is out of the aforementioned range listed, please consider follow up with your Primary Care Provider.  If you are age 60 or younger, your body mass index should be between 19-25. Your Body mass index is 21.63 kg/m. If this is out of the aformentioned range listed, please consider follow up with your Primary Care Provider.   ________________________________________________________  The [facility] providers would like to encourage you to use MYCHART to communicate with providers for non-urgent requests or questions.  Due to long hold times on the telephone, sending your provider a message by [facility] may be a faster and more efficient way to get a response.  Please allow 48 business hours for a response.  Please remember that this is for non-urgent requests.  _______________________________________________________

## 2023-12-19 NOTE — Progress Notes (Signed)
 I agree with the assessment and plan as outlined by Ms. Steffanie Dunn.

## 2024-01-07 NOTE — Patient Instructions (Addendum)
 Dawn [name] was not able to tolerate the amoxicillin 400 mg/5 mL challenge today at the office without adverse sighn or symptoms of an allergic reaction.  - Continue to avoid penicillin along with 1st and 2nd generation cephalosporins.  Follow-up in 6 to 12 months or sooner if needed

## 2024-01-08 ENCOUNTER — Ambulatory Visit: Admitting: Family

## 2024-01-08 ENCOUNTER — Encounter: Payer: Self-pay | Admitting: Family

## 2024-01-08 ENCOUNTER — Telehealth: Payer: Self-pay

## 2024-01-08 VITALS — BP 120/80 | HR 78 | Temp 97.8°F | Resp 18 | Ht 65.0 in | Wt 132.4 lb

## 2024-01-08 DIAGNOSIS — R0602 Shortness of breath: Secondary | ICD-10-CM

## 2024-01-08 DIAGNOSIS — R21 Rash and other nonspecific skin eruption: Secondary | ICD-10-CM

## 2024-01-08 DIAGNOSIS — Z880 Allergy status to penicillin: Secondary | ICD-10-CM | POA: Diagnosis not present

## 2024-01-08 MED ORDER — EPINEPHRINE (ANAPHYLAXIS) 1 MG/ML IJ SOLN
1.0000 mg | Freq: Once | INTRAMUSCULAR | Status: AC
  Administered 2024-01-08: 0.3 mg via INTRAMUSCULAR

## 2024-01-08 NOTE — Telephone Encounter (Signed)
 Spoke with patient after today's office visit checking on her and how she is feeling after reaction and Epi injection. Patient states that she was feeling a little woozy but in all she is doing better and is at work. No concerns or questions.

## 2024-01-08 NOTE — Progress Notes (Signed)
 [address] [place] [address] Dept: [id number]  FOLLOW UP NOTE  Patient ID: Dawn [name], female    DOB: [birth year]  Age: 33 y.o. MRN: [id number] Date of Office Visit: 01/08/2024  Assessment  Chief Complaint: No chief complaint on file.  HPI Dawn [name] is a 33 year old female who presents today for penicillin skin testing and possible oral challenge to amoxicillin 400 mg per 5 mL.  She was last seen on December 10, 2023 by Dr. Marinda for pollen food allergy  syndrome, penicillin allergy , adverse reaction to prednisone, and gastrointestinal symptoms.  She denies any new diagnosis or surgery since her last office visit.  She recalls being allergic to penicillin as a child with symptoms of rash and eczema, but she does not remember the details that she was very young.  She reports that she has been off all antihistamines for the past 3 days.  She reports that she normally has palpitations, drippy nose, watery eyes due to having severe dry eyes and she normally has shortness of breath when eating due to having dysautonomia.  Otherwise she denies coughing, wheezing, and tightness in her chest.  She also denies any nausea, vomiting, diarrhea, or abdominal pain.  Opportunity given to ask question.  Informed consent signed.  After the penicillin skin testing had started she reports that she spoke with her mom and was told that her reaction with penicillin caused hives on her chest and joint swelling.   Drug Allergies:  Allergies  Allergen Reactions   Latex Dermatitis   Nickel Dermatitis   Amoxicillin    Ceclor [Cefaclor]    Gabapentin     Thinks it makes her constipated   Penicillins    Prednisone Diarrhea and Nausea And Vomiting   Silicone Dermatitis    Review of Systems: Negative except as per HPI   Physical Exam: Ht 5' 5 (1.651 m)   BMI 21.63 kg/m    Physical Exam Constitutional:      Appearance: Normal appearance.  HENT:     Head: Normocephalic and atraumatic.      Comments: Pharynx normal, eyes normal, ears normal, nose normal    Right Ear: Tympanic membrane, ear canal and external ear normal.     Left Ear: Tympanic membrane, ear canal and external ear normal.     Nose: Nose normal.     Mouth/Throat:     Mouth: Mucous membranes are moist.     Pharynx: Oropharynx is clear.  Eyes:     Conjunctiva/sclera: Conjunctivae normal.  Cardiovascular:     Rate and Rhythm: Regular rhythm.     Heart sounds: Normal heart sounds.  Pulmonary:     Effort: Pulmonary effort is normal.     Breath sounds: Normal breath sounds.     Comments: Lungs clear to auscultation Musculoskeletal:     Cervical back: Neck supple.  Skin:    General: Skin is warm.  Neurological:     Mental Status: She is alert and oriented to person, place, and time.  Psychiatric:        Mood and Affect: Mood normal.        Behavior: Behavior normal.        Thought Content: Thought content normal.        Judgment: Judgment normal.     Diagnostics: FVC 3.47 L (89%), FEV1 2.71 L (83%), FEV1/FVC 0.78.  Spirometry indicates normal spirometry  Open graded amoxicillin 400 mg/ 5 mL oral challenge: The patient was not able to tolerate the  challenge today without adverse signs or symptoms. Vital signs were stable throughout the challenge and observation period. She received multiple doses separated by 30 minutes, each of which was separated by vitals and a brief physical exam. She received the following doses: 1 mL and 9 mL.  At 11:47 AM she reports that she has some shortness of breath.  She reports that she cannot tell if it is her normal shortness of breath or if it is her anxiety.  She denies any concomitant cardiorespiratory or cutaneous symptoms.  At 11:51 AM she reports that she has some tickling in her throat.  At 11:52 AM 0.3 mg of epinephrine  given IM in her left deltoid.  At 11:54 AM she reports that her itchy throat is better and at 11:55 AM she reports that her breathing is good.  She was  monitored for 60 minutes following the dose of epinephrine .   The patient had negative penicillin skin testing today and was not able to tolerate the open graded oral challenge today without adverse signs or symptoms.   Assessment and Plan: 1. Rash   2. Penicillin allergy    3. Shortness of breath     No orders of the defined types were placed in this encounter.   Patient Instructions  Dawn [name] was not able to tolerate the amoxicillin 400 mg/5 mL challenge today at the office without adverse sighn or symptoms of an allergic reaction.  - Continue to avoid penicillin along with 1st and 2nd generation cephalosporins.  Follow-up in 6 to 12 months or sooner if needed    Return in about 6 months (around 07/10/2024), or if symptoms worsen or fail to improve.    Thank you for the opportunity to care for this patient.  Please do not hesitate to contact me with questions.  Wanda Craze, FNP [facility] 

## 2024-01-08 NOTE — Addendum Note (Signed)
 Addended by: NANCEE JON SAILOR on: 01/08/2024 01:45 PM   Modules accepted: Orders

## 2024-01-08 NOTE — Addendum Note (Signed)
 Addended by: NANCEE JON SAILOR on: 01/08/2024 03:34 PM   Modules accepted: Orders

## 2024-02-05 NOTE — Telephone Encounter (Signed)
 The dietician closed Dawn [name]'s referral due to Patient Refusal

## 2024-03-04 ENCOUNTER — Encounter: Payer: Self-pay | Admitting: Internal Medicine

## 2024-03-05 NOTE — Telephone Encounter (Signed)
 It is hard to say if this is from the injection or not. It would be a very rare adverse event if at all related. It looks like potentially a nodule or some type of scar tissue.

## 2024-03-25 ENCOUNTER — Telehealth: Payer: Self-pay

## 2024-03-25 NOTE — Telephone Encounter (Signed)
 Patient was seen in July by Alan Coombs and wanted to wait to schedule her EGD/ Colon 2 months after her procedure that was on 01/31/24.

## 2024-03-25 NOTE — Telephone Encounter (Signed)
 Lmtcb.  (Re: schedule EGD/ Colon in [facility])

## 2024-03-28 NOTE — Telephone Encounter (Signed)
 Lmtcb.  (Mz:Nrunazm call back as requested to schedule EGD/ colon)

## 2024-04-03 NOTE — Telephone Encounter (Signed)
 Lmtcb

## 2024-06-26 ENCOUNTER — Encounter: Payer: Self-pay | Admitting: Internal Medicine

## 2024-06-26 NOTE — Telephone Encounter (Signed)
Please have patient schedule follow up appointment to discuss.

## 2024-06-27 ENCOUNTER — Ambulatory Visit: Admitting: Internal Medicine

## 2024-06-27 ENCOUNTER — Encounter: Payer: Self-pay | Admitting: Internal Medicine

## 2024-06-27 VITALS — BP 108/76 | HR 80 | Temp 97.7°F | Resp 20 | Wt 130.0 lb

## 2024-06-27 DIAGNOSIS — L253 Unspecified contact dermatitis due to other chemical products: Secondary | ICD-10-CM | POA: Insufficient documentation

## 2024-06-27 DIAGNOSIS — K219 Gastro-esophageal reflux disease without esophagitis: Secondary | ICD-10-CM | POA: Diagnosis not present

## 2024-06-27 DIAGNOSIS — J310 Chronic rhinitis: Secondary | ICD-10-CM

## 2024-06-27 DIAGNOSIS — L299 Pruritus, unspecified: Secondary | ICD-10-CM | POA: Insufficient documentation

## 2024-06-27 DIAGNOSIS — Z880 Allergy status to penicillin: Secondary | ICD-10-CM | POA: Diagnosis not present

## 2024-06-27 MED ORDER — NEFFY 2 MG/0.1ML NA SOLN: 1.0000 | NASAL | 1 refills | Status: AC | PRN

## 2024-06-27 NOTE — Progress Notes (Signed)
 "  FOLLOW UP Date of Service/Encounter:   06/27/2024  Subjective:  Dawn [name] (DOB: Nov 08, 1990) is a 34 y.o. female who [facility] in re-evaluation of the following: Concern for avocado allergy , concern for latex allergy , chronic rhinitis History obtained from: chart review and patient.  For Review, LV was on 12/10/23  with Dr.Noheli Melder seen for intial visit for chronic abdominal symptoms, pollen food allergy  syndrome with avocado, penicillin allergy , adverse reaction to prednisone. See below for summary of history and diagnostics.  ----------------------------------------------------- Pertinent History/Diagnostics:  Pollen food allergy  syndrome Itchy mouth with avocado likely due to birch pollen-related allergy . Symptoms mild, managed by avoidance or antihistamines. - Recommended to avoid avocado or take Zyrtec if symptoms occur. Penicillin allergy  Reported childhood eczema and rash after penicillin. Likely outgrown or never true allergy . Most people outgrow penicillin allergy  after 10 years. Discussed penicillin allergy  testing. Adverse reaction to prednisone Severe vomiting and diarrhea post-prednisone Recommended avoidance and alternative steroid class if needed.  Gastrointestinal symptom Chronic constipation, bloating, acid reflux, throat fullness. No specific food triggers. Differential includes food intolerance, eosinophilic esophagitis, severe acid reflux among others. No evidence of IgE mediated food allergy . GI workup recommended. - Proceed with GI workup  --------------------------------------------------- Today presents for follow-up. Discussed the use of AI scribe software for clinical note transcription with the patient, who gave verbal consent to proceed.  History of Present Illness Dawn [name] is a 34 year old female who presents with suspected food and latex allergies.  Oropharyngeal symptoms with food ingestion - Tingling  sensations of lips, mouth, and throat after consuming kiwi, alleviated by Benadryl. - Benadryl causes significant drowsiness, impairing ability to stay awake at work. - History of vomiting after eating kiwi several years ago, initially attributed to migraine but now suspected to be related to kiwi sensitivity. - Tingling in mouth after consuming avocados. - Oral pain with pineapple ingestion, leading to avoidance of pineapple. - Avoids bananas due to personal preference, not allergy .  Cutaneous reactions to latex exposure - Redness and peeling of fingertips after wearing latex gloves. - Avoids latex products, including balloons and condoms, to prevent reactions.  Allergy  evaluation and testing - No formal allergy  testing performed for implicated foods or latex. - No evaluation by gastroenterology for endoscopy or colonoscopy due to financial constraints following recent hip screw removal surgery.  Chronic rhinitis: Sneezing and sniffles primarily in the spring.  Typically symptoms are mild and she does not treat with any medications.  All medications reviewed by clinical staff and updated in chart. No new pertinent medical or surgical history except as noted in HPI.  ROS: All others negative except as noted per HPI.   Objective:  BP 108/76 (BP Location: Right Arm, Patient Position: Sitting)   Pulse 80   Temp 97.7 F (36.5 C) (Oral)   Resp 20   Wt 130 lb (59 kg) Comment: patient stated weight  SpO2 100%   BMI 21.63 kg/m  Body mass index is 21.63 kg/m. Physical Exam: General Appearance:  Alert, cooperative, no distress, appears stated age  Head:  Normocephalic, without obvious abnormality, atraumatic  Eyes:  Conjunctiva clear, EOM's intact  Ears EACs normal bilaterally and normal TMs bilaterally  Nose: Nares normal, hypertrophic turbinates, normal mucosa, and no visible anterior polyps  Throat: Lips, tongue normal; teeth and gums normal, normal posterior oropharynx  Neck:  Supple, symmetrical  Lungs:   clear to auscultation bilaterally, Respirations unlabored, no coughing  Heart:  regular rate and  rhythm and no murmur, Appears well perfused  Extremities: No edema  Skin: Skin color, texture, turgor normal and no rashes or lesions on visualized portions of skin  Neurologic: No gross deficits   Labs:  Lab Orders  No laboratory test(s) ordered today     Assessment/Plan   Pollen food allergy  syndrome Itchy mouth with avocado likely due to birch pollen-related allergy  and or potentially latex-food allergy . Symptoms mild, managed by avoidance or antihistamines. - Avoid avocado or take Zyrtec if symptoms occur.  Kiwi allergy :  Itchy mouth and throat from kiwi consumption, prior history of vomiting with kiwi, unclear if related. - strict avoidance of kiwi - prescribe nasal epinephrine , will go send out pharmacy-let us  know if poor coverage and will send alternative - food allergy  action plan provided and reviewed  Latex Contact Dermatitis:  Rash after using latex gloves. - avoid latex products  Chronic Rhinitis:  Sneezing and sniffles in Spring - can take daily antihistamine if needed. Your options include: Zyrtec (cetirizine) 10 mg, Claritin (loratadine) 10 mg, Xyzal (levocetirizine) 5 mg or Allegra (fexofenadine) 180 mg daily as needed - return for skin testing to environmental alleriges  Penicillin allergy  Reported childhood eczema and rash after penicillin. Likely outgrown or never true allergy . Most people outgrow penicillin allergy  after 10 years. Discussed penicillin allergy  testing. - Schedule penicillin skin testing. - If skin test is negative, perform amoxicillin challenge.  Adverse reaction to prednisone Severe vomiting and diarrhea post-prednisone Recommended avoidance and alternative steroid class if needed. - Avoid prednisone. - Consider alternative class of steroids if needed.  Gastrointestinal symptoms Chronic constipation, bloating,  acid reflux, throat fullness. No specific food triggers. Differential includes food intolerance, eosinophilic esophagitis, severe acid reflux among others. No evidence of IgE mediated food allergy . GI workup recommended. - Proceed with GI workup  - most common food triggers via skin testing for patient reassurance at follow-up  Follow up :  skin testing Monday 26th in [place] office at 8:30 AM (1-68, avocado and fresh kiwi) It was a pleasure meeting you in clinic today! Thank you for allowing me to participate in your care.  Rocky Endow, MD [facility]  Other: none  Rocky Endow, MD  [facility] [place]        "

## 2024-06-27 NOTE — Patient Instructions (Addendum)
 Pollen food allergy  syndrome Itchy mouth with avocado likely due to birch pollen-related allergy  and or potentially latex-food allergy . Symptoms mild, managed by avoidance or antihistamines. - Avoid avocado or take Zyrtec if symptoms occur.  Kiwi allergy :  Itchy mouth and throat from kiwi consumption, prior history of vomiting with kiwi, unclear if related. - strict avoidance of kiwi - prescribe nasal epinephrine , will go send out pharmacy-let us  know if poor coverage and will send alternative - food allergy  action plan provided and reviewed  Latex Contact Dermatitis:  Rash after using latex gloves. - avoid latex products  Chronic Rhinitis:  Sneezing and sniffles in Spring - can take daily antihistamine if needed. Your options include: Zyrtec (cetirizine) 10 mg, Claritin (loratadine) 10 mg, Xyzal (levocetirizine) 5 mg or Allegra (fexofenadine) 180 mg daily as needed - return for skin testing to environmental alleriges  Penicillin allergy  Reported childhood eczema and rash after penicillin. Likely outgrown or never true allergy . Most people outgrow penicillin allergy  after 10 years. Discussed penicillin allergy  testing. - Schedule penicillin skin testing. - If skin test is negative, perform amoxicillin challenge.  Adverse reaction to prednisone Severe vomiting and diarrhea post-prednisone Recommended avoidance and alternative steroid class if needed. - Avoid prednisone. - Consider alternative class of steroids if needed.  Gastrointestinal symptoms Chronic constipation, bloating, acid reflux, throat fullness. No specific food triggers. Differential includes food intolerance, eosinophilic esophagitis, severe acid reflux among others. No evidence of IgE mediated food allergy . GI workup recommended. - Proceed with GI workup  - most common food triggers via skin testing for patient reassurance at follow-up  Follow up :  skin testing Monday 26th in [place] at 8:30 AM (1-68, avocado and  fresh kiwi) It was a pleasure meeting you in clinic today! Thank you for allowing me to participate in your care.  Rocky Endow, MD [facility] 

## 2024-06-30 ENCOUNTER — Encounter: Payer: Self-pay | Admitting: Internal Medicine

## 2024-07-08 ENCOUNTER — Encounter: Payer: Self-pay | Admitting: Internal Medicine

## 2024-07-14 ENCOUNTER — Ambulatory Visit: Admitting: Internal Medicine

## 2024-07-24 ENCOUNTER — Ambulatory Visit: Admitting: Family Medicine

## 2024-07-24 ENCOUNTER — Encounter: Payer: Self-pay | Admitting: Family Medicine

## 2024-07-24 DIAGNOSIS — T7819XD Other adverse food reactions, not elsewhere classified, subsequent encounter: Secondary | ICD-10-CM

## 2024-07-24 DIAGNOSIS — T380X5A Adverse effect of glucocorticoids and synthetic analogues, initial encounter: Secondary | ICD-10-CM | POA: Insufficient documentation

## 2024-07-24 DIAGNOSIS — J3089 Other allergic rhinitis: Secondary | ICD-10-CM

## 2024-07-24 DIAGNOSIS — T7819XA Other adverse food reactions, not elsewhere classified, initial encounter: Secondary | ICD-10-CM | POA: Insufficient documentation

## 2024-07-24 DIAGNOSIS — Z91040 Latex allergy status: Secondary | ICD-10-CM | POA: Insufficient documentation

## 2024-07-24 DIAGNOSIS — T7800XA Anaphylactic reaction due to unspecified food, initial encounter: Secondary | ICD-10-CM | POA: Insufficient documentation

## 2024-07-24 DIAGNOSIS — T7800XD Anaphylactic reaction due to unspecified food, subsequent encounter: Secondary | ICD-10-CM

## 2024-07-24 DIAGNOSIS — Z880 Allergy status to penicillin: Secondary | ICD-10-CM

## 2024-07-24 DIAGNOSIS — J302 Other seasonal allergic rhinitis: Secondary | ICD-10-CM | POA: Insufficient documentation

## 2024-07-24 DIAGNOSIS — Z889 Allergy status to unspecified drugs, medicaments and biological substances status: Secondary | ICD-10-CM | POA: Insufficient documentation

## 2024-07-24 DIAGNOSIS — R140 Abdominal distension (gaseous): Secondary | ICD-10-CM | POA: Insufficient documentation

## 2024-07-24 NOTE — Progress Notes (Signed)
 "  [address] [place] [address] Dept: [phone]  FOLLOW UP NOTE  Patient ID: Dawn [name], female    DOB: Apr 28, 1991  Age: 34 y.o. MRN: [id number] Date of Office Visit: 07/24/2024  Assessment  Chief Complaint: Allergy  Testing  HPI Dawn [name] is a 34 year old female who presents to the clinic for allergy  skin testing. She was last seen in this clinic on 06/27/2024 by Dr. Marinda for evaluation of oral allergy  syndrome, food allergy  to kiwi, latex allergy , chronic rhinitis, penicillin allergy , adverse reaction to prednisone, and GI symptoms.   At today's visit, she reports that she is feeling well with no cardiopulmonary, gastrointestinal or integumentary symptoms. She has not had any antihistamines over the last 3 days.   Her current medications are listed in the chart.   Drug Allergies:  Allergies[1]   Diagnostics: Percutaneous environmental allergy  testing positive to dust mites and cockroach with adequate ontrols.   Selected food allergy  skin testing positive to kiwi and negative to remainer of foods tested with adequate controlls.   Intradermal sllergy testing was positive to [place], ragweed mix, tree mix, mold 1, mold 2, mold 3, and mold 4 with an adequate control.  Assessment and Plan: 1. Seasonal and perennial allergic rhinitis   2. Allergy  with anaphylaxis due to food   3. Pollen-food allergy , subsequent encounter   4. Latex allergy    5. Penicillin allergy    6. Prednisone adverse reaction, initial encounter   7. Abdominal bloating     No orders of the defined types were placed in this encounter.   Patient Instructions  Allergic rhinitis  Your skin testing at today's visit was positive for grass pollen, ragweed pollen, tree pollen, indoor mold, outdoor mold, dust mite, and cockroach.   Continue an antihistamine once a day if needed for runny nose or itch.  You may take an additional dose of antihistamine once a day if needed for breakthrough symptoms   Consider Flonase 2 sprays in each nostril once a day if needed for stuffy nose .  In the right nostril, point the applicator out toward the right ear. In the left nostril, point the applicator out toward the left ear consider saline nasal rinses as needed for nasal symptoms. Use this before any medicated nasal sprays for best result pollen food allergy  syndrome Consider allergen immunotherapy if your symptoms are not well-controlled with the treatment plan as listed above  Food allergy  Your skin testing was positive to kiwi at today's visit.  Continue to carry nasal epinephrine  to set 2 devices  Oral allergy  syndrome Itchy mouth with avocado likely due to birch pollen-related allergy  and or potentially latex-food allergy . Symptoms mild, managed by avoidance or antihistamines. - Avoid avocado or take Zyrtec if symptoms occur.  Latex Contact Dermatitis:  Rash after using latex gloves. - avoid latex products  Penicillin allergy  - Schedule penicillin skin testing. - If skin test is negative, perform amoxicillin challenge.  Adverse reaction to prednisone Severe vomiting and diarrhea post-prednisone Recommended avoidance and alternative steroid class if needed. - Avoid prednisone. - Consider alternative class of steroids if needed.  Gastrointestinal symptoms Chronic constipation, bloating, acid reflux, throat fullness. No specific food triggers. Differential includes food intolerance, eosinophilic esophagitis, severe acid reflux among others. No evidence of IgE mediated food allergy . GI workup recommended. - Proceed with GI workup  - most common food triggers via skin testing were negative at today's visit.  Skin testing for kiwi was positive  Call the clinic if this  treatment plan is not working well for you.  Follow up in 3 months or sooner if needed.  Reducing Pollen Exposure The American Academy of All[employer] , Asthma and Immunology suggests the following steps to reduce your exposure  to pollen during allergy  seasons. Do not hang sheets or clothing out to dry; pollen may collect on these items. Do not mow lawns or spend time around freshly cut grass; mowing stirs up pollen. Keep windows closed at night.  Keep car windows closed while driving. Minimize morning activities outdoors, a time when pollen counts are usually at their highest. Stay indoors as much as possible when pollen counts or humidity is high and on windy days when pollen tends to remain in the air longer. Use air conditioning when possible.  Many air conditioners have filters that trap the pollen spores. Use a HEPA room air filter to remove pollen form the indoor air you breathe.  Control of Mold Allergen Mold and fungi can grow on a variety of surfaces provided certain temperature and moisture conditions exist.  Outdoor molds grow on plants, decaying vegetation and soil.  The major outdoor mold, Alternaria and Cladosporium, are found in very high numbers during hot and dry conditions.  Generally, a late Summer - Fall peak is seen for common outdoor fungal spores.  Rain will temporarily lower outdoor mold spore count, but counts rise rapidly when the rainy period ends.  The most important indoor molds are Aspergillus and Penicillium.  Dark, humid and poorly ventilated basements are ideal sites for mold growth.  The next most common sites of mold growth are the bathroom and the kitchen.  Outdoor [employer] Use air conditioning and keep windows closed Avoid exposure to decaying vegetation. Avoid leaf raking. Avoid grain handling. Consider wearing a face mask if working in moldy areas.  Indoor Mold Control Maintain humidity below 50%. Clean washable surfaces with 5% bleach solution. Remove sources e.g. Contaminated carpets.   Control of Dust Mite Allergen Dust mites play a major role in allergic asthma and rhinitis. They occur in environments with high humidity wherever human skin is found. Dust mites absorb  humidity from the atmosphere (ie, they do not drink) and feed on organic matter (including shed human and animal skin). Dust mites are a microscopic type of insect that you cannot see with the naked eye. High levels of dust mites have been detected from mattresses, pillows, carpets, upholstered furniture, bed covers, clothes, soft toys and any woven material. The principal allergen of the dust mite is found in its feces. A gram of dust may contain 1,000 mites and 250,000 fecal particles. Mite antigen is easily measured in the air during house cleaning activities. Dust mites do not bite and do not cause harm to humans, other than by triggering allergies/asthma.  Ways to decrease your exposure to dust mites in your home:  1. Encase mattresses, box springs and pillows with a mite-impermeable barrier or cover  2. Wash sheets, blankets and drapes weekly in hot water (130 F) with detergent and dry them in a dryer on the hot setting.  3. Have the room cleaned frequently with a vacuum cleaner and a damp dust-mop. For carpeting or rugs, vacuuming with a vacuum cleaner equipped with a high-efficiency particulate air (HEPA) filter. The dust mite allergic individual should not be in a room which is being cleaned and should wait 1 hour after cleaning before going into the room.  4. Do not sleep on upholstered furniture (eg, couches).  5. If  possible removing carpeting, upholstered furniture and drapery from the home is ideal. Horizontal blinds should be eliminated in the rooms where the person spends the most time (bedroom, study, television room). Washable vinyl, roller-type shades are optimal.  6. Remove all non-washable stuffed toys from the bedroom. Wash stuffed toys weekly like sheets and blankets above.  7. Reduce indoor humidity to less than 50%. Inexpensive humidity monitors can be purchased at most hardware stores. Do not use a humidifier as can make the problem worse and are not recommended.  Control  of Cockroach Allergen Cockroach allergen has been identified as an important cause of acute attacks of asthma, especially in urban settings.  There are fifty-five species of cockroach that exist in the [place] , however only three, the American, German and Oriental species produce allergen that can affect patients with Asthma.  Allergens can be obtained from fecal particles, egg casings and secretions from cockroaches.    Remove food sources. Reduce access to water. Seal access and entry points. Spray runways with 0.5-1% Diazinon or Chlorpyrifos Blow boric acid power under stoves and refrigerator. Place bait stations (hydramethylnon) at feeding sites.      Return in about 3 months (around 10/21/2024), or if symptoms worsen or fail to improve.    Thank you for the opportunity to care for this patient.  Please do not hesitate to contact me with questions.  Arlean Mutter, FNP [facility]          [1]  Allergies Allergen Reactions   Latex Dermatitis   Nickel Dermatitis   Amoxicillin    Ceclor [Cefaclor]    Gabapentin     Thinks it makes her constipated   Kiwi Extract     Vomiting and mouth itching   Penicillins    Prednisone Diarrhea and Nausea And Vomiting   Silicone Dermatitis   "

## 2024-07-24 NOTE — Patient Instructions (Addendum)
 Allergic rhinitis  Your skin testing at today's visit was positive for grass pollen, ragweed pollen, tree pollen, indoor mold, outdoor mold, dust mite, and cockroach.   Continue an antihistamine once a day if needed for runny nose or itch.  You may take an additional dose of antihistamine once a day if needed for breakthrough symptoms  Consider Flonase 2 sprays in each nostril once a day if needed for stuffy nose .  In the right nostril, point the applicator out toward the right ear. In the left nostril, point the applicator out toward the left ear consider saline nasal rinses as needed for nasal symptoms. Use this before any medicated nasal sprays for best result pollen food allergy  syndrome Consider allergen immunotherapy if your symptoms are not well-controlled with the treatment plan as listed above  Food allergy  Your skin testing was positive to kiwi at today's visit.  Continue to carry nasal epinephrine  to set 2 devices  Oral allergy  syndrome Itchy mouth with avocado likely due to birch pollen-related allergy  and or potentially latex-food allergy . Symptoms mild, managed by avoidance or antihistamines. - Avoid avocado or take Zyrtec if symptoms occur.  Latex Contact Dermatitis:  Rash after using latex gloves. - avoid latex products  Penicillin allergy  - Schedule penicillin skin testing. - If skin test is negative, perform amoxicillin challenge.  Adverse reaction to prednisone Severe vomiting and diarrhea post-prednisone Recommended avoidance and alternative steroid class if needed. - Avoid prednisone. - Consider alternative class of steroids if needed.  Gastrointestinal symptoms Chronic constipation, bloating, acid reflux, throat fullness. No specific food triggers. Differential includes food intolerance, eosinophilic esophagitis, severe acid reflux among others. No evidence of IgE mediated food allergy . GI workup recommended. - Proceed with GI workup  - most common food triggers  via skin testing were negative at today's visit.  Skin testing for kiwi was positive  Call the clinic if this treatment plan is not working well for you.  Follow up in 3 months or sooner if needed.  Reducing Pollen Exposure The [employer] , [employer] suggests the following steps to reduce your exposure to pollen during allergy  seasons. Do not hang sheets or clothing out to dry; pollen may collect on these items. Do not mow lawns or spend time around freshly cut grass; mowing stirs up pollen. Keep windows closed at night.  Keep car windows closed while driving. Minimize morning activities outdoors, a time when pollen counts are usually at their highest. Stay indoors as much as possible when pollen counts or humidity is high and on windy days when pollen tends to remain in the air longer. Use air conditioning when possible.  Many air conditioners have filters that trap the pollen spores. Use a HEPA room air filter to remove pollen form the indoor air you breathe.  Control of Mold Allergen Mold and fungi can grow on a variety of surfaces provided certain temperature and moisture conditions exist.  Outdoor molds grow on plants, decaying vegetation and soil.  The major outdoor mold, Alternaria and Cladosporium, are found in very high numbers during hot and dry conditions.  Generally, a late Summer - Fall peak is seen for common outdoor fungal spores.  Rain will temporarily lower outdoor mold spore count, but counts rise rapidly when the rainy period ends.  The most important indoor molds are Aspergillus and Penicillium.  Dark, humid and poorly ventilated basements are ideal sites for mold growth.  The next most common sites of mold growth are the bathroom  and the kitchen.  Outdoor [employer] Use air conditioning and keep windows closed Avoid exposure to decaying vegetation. Avoid leaf raking. Avoid grain handling. Consider wearing a face mask if working in moldy  areas.  Indoor Mold Control Maintain humidity below 50%. Clean washable surfaces with 5% bleach solution. Remove sources e.g. Contaminated carpets.   Control of Dust Mite Allergen Dust mites play a major role in allergic asthma and rhinitis. They occur in environments with high humidity wherever human skin is found. Dust mites absorb humidity from the atmosphere (ie, they do not drink) and feed on organic matter (including shed human and animal skin). Dust mites are a microscopic type of insect that you cannot see with the naked eye. High levels of dust mites have been detected from mattresses, pillows, carpets, upholstered furniture, bed covers, clothes, soft toys and any woven material. The principal allergen of the dust mite is found in its feces. A gram of dust may contain 1,000 mites and 250,000 fecal particles. Mite antigen is easily measured in the air during house cleaning activities. Dust mites do not bite and do not cause harm to humans, other than by triggering allergies/asthma.  Ways to decrease your exposure to dust mites in your home:  1. Encase mattresses, box springs and pillows with a mite-impermeable barrier or cover  2. Wash sheets, blankets and drapes weekly in hot water (130 F) with detergent and dry them in a dryer on the hot setting.  3. Have the room cleaned frequently with a vacuum cleaner and a damp dust-mop. For carpeting or rugs, vacuuming with a vacuum cleaner equipped with a high-efficiency particulate air (HEPA) filter. The dust mite allergic individual should not be in a room which is being cleaned and should wait 1 hour after cleaning before going into the room.  4. Do not sleep on upholstered furniture (eg, couches).  5. If possible removing carpeting, upholstered furniture and drapery from the home is ideal. Horizontal blinds should be eliminated in the rooms where the person spends the most time (bedroom, study, television room). Washable vinyl, roller-type  shades are optimal.  6. Remove all non-washable stuffed toys from the bedroom. Wash stuffed toys weekly like sheets and blankets above.  7. Reduce indoor humidity to less than 50%. Inexpensive humidity monitors can be purchased at most hardware stores. Do not use a humidifier as can make the problem worse and are not recommended.  Control of Cockroach Allergen Cockroach allergen has been identified as an important cause of acute attacks of asthma, especially in urban settings.  There are fifty-five species of cockroach that exist in the [place] , however only three, the American, German and Oriental species produce allergen that can affect patients with Asthma.  Allergens can be obtained from fecal particles, egg casings and secretions from cockroaches.    Remove food sources. Reduce access to water. Seal access and entry points. Spray runways with 0.5-1% Diazinon or Chlorpyrifos Blow boric acid power under stoves and refrigerator. Place bait stations (hydramethylnon) at feeding sites.

## 2024-10-27 ENCOUNTER — Ambulatory Visit: Admitting: Internal Medicine
